# Patient Record
Sex: Male | Born: 1940
Health system: Southern US, Community
[De-identification: ages and names within clinical notes are randomized; demographics above are authoritative.]

## PROBLEM LIST (undated history)

## (undated) DIAGNOSIS — A669 Yaws, unspecified: Secondary | ICD-10-CM

## (undated) DIAGNOSIS — E785 Hyperlipidemia, unspecified: Secondary | ICD-10-CM

## (undated) DIAGNOSIS — E119 Type 2 diabetes mellitus without complications: Secondary | ICD-10-CM

## (undated) DIAGNOSIS — C801 Malignant (primary) neoplasm, unspecified: Secondary | ICD-10-CM

## (undated) DIAGNOSIS — E1122 Type 2 diabetes mellitus with diabetic chronic kidney disease: Secondary | ICD-10-CM

## (undated) DIAGNOSIS — I10 Essential (primary) hypertension: Secondary | ICD-10-CM

## (undated) HISTORY — DX: Malignant (primary) neoplasm, unspecified: C80.1

## (undated) HISTORY — DX: Yaws, unspecified: A66.9

## (undated) HISTORY — DX: Hyperlipidemia, unspecified: E78.5

## (undated) HISTORY — DX: Type 2 diabetes mellitus with diabetic chronic kidney disease: E11.22

---

## 2002-04-24 HISTORY — PX: PROSTATE SURGERY: SHX751

## 2005-04-24 DIAGNOSIS — E1122 Type 2 diabetes mellitus with diabetic chronic kidney disease: Secondary | ICD-10-CM

## 2005-04-24 HISTORY — DX: Type 2 diabetes mellitus with diabetic chronic kidney disease: E11.22

## 2014-10-09 ENCOUNTER — Other Ambulatory Visit: Payer: Self-pay | Admitting: Family Medicine

## 2014-10-21 ENCOUNTER — Ambulatory Visit: Payer: Self-pay | Admitting: Family Medicine

## 2014-10-23 ENCOUNTER — Ambulatory Visit (INDEPENDENT_AMBULATORY_CARE_PROVIDER_SITE_OTHER): Payer: PPO | Admitting: Family Medicine

## 2014-10-23 ENCOUNTER — Encounter: Payer: Self-pay | Admitting: Family Medicine

## 2014-10-23 VITALS — BP 126/74 | HR 68 | Temp 97.4°F | Resp 16 | Ht 68.5 in | Wt 177.2 lb

## 2014-10-23 DIAGNOSIS — E1122 Type 2 diabetes mellitus with diabetic chronic kidney disease: Secondary | ICD-10-CM

## 2014-10-23 DIAGNOSIS — D171 Benign lipomatous neoplasm of skin and subcutaneous tissue of trunk: Secondary | ICD-10-CM | POA: Diagnosis not present

## 2014-10-23 DIAGNOSIS — N183 Chronic kidney disease, stage 3 (moderate): Secondary | ICD-10-CM | POA: Diagnosis not present

## 2014-10-23 DIAGNOSIS — E785 Hyperlipidemia, unspecified: Secondary | ICD-10-CM | POA: Diagnosis not present

## 2014-10-23 DIAGNOSIS — I10 Essential (primary) hypertension: Secondary | ICD-10-CM | POA: Diagnosis not present

## 2014-10-23 DIAGNOSIS — N189 Chronic kidney disease, unspecified: Secondary | ICD-10-CM | POA: Diagnosis not present

## 2014-10-23 DIAGNOSIS — E1169 Type 2 diabetes mellitus with other specified complication: Secondary | ICD-10-CM | POA: Diagnosis not present

## 2014-10-23 DIAGNOSIS — N1831 Chronic kidney disease, stage 3a: Secondary | ICD-10-CM

## 2014-10-23 DIAGNOSIS — N182 Chronic kidney disease, stage 2 (mild): Secondary | ICD-10-CM | POA: Insufficient documentation

## 2014-10-23 LAB — POCT GLYCOSYLATED HEMOGLOBIN (HGB A1C): Hemoglobin A1C: 6.3

## 2014-10-23 MED ORDER — GLIPIZIDE 5 MG PO TABS: 5.0000 mg | ORAL_TABLET | Freq: Every day | ORAL | Status: DC

## 2014-10-23 MED ORDER — LISINOPRIL 20 MG PO TABS: 20.0000 mg | ORAL_TABLET | Freq: Every day | ORAL | Status: DC

## 2014-10-23 NOTE — Assessment & Plan Note (Signed)
Last GFR was 53- recheck CMP today. On ACE for renal protection. Pt is aware to stay hydrated and avoid OTC NSAIDs.  Consider nephrology referral when GFR <= 30.

## 2014-10-23 NOTE — Progress Notes (Signed)
Subjective:    Patient ID: Daniel Carney, male    DOB: [birth year], 74 y.o.   MRN: [id number]  HPI: Daniel Carney is a 74 y.o. male presenting on 10/23/2014 for Diabetes; Hypertension; and Hyperlipidemia  Last A1c was 6.7% in March of 2016. He has been compliant with diet changes and exercises daily.   Diabetes He presents for his follow-up diabetic visit. He has type 2 diabetes mellitus. His disease course has been improving. There are no hypoglycemic associated symptoms. Pertinent negatives for hypoglycemia include no headaches. Pertinent negatives for diabetes include no blurred vision, no chest pain, no foot paresthesias, no polydipsia, no polyphagia, no polyuria and no visual change. There are no hypoglycemic complications. Diabetic complications include nephropathy. Risk factors for coronary artery disease include diabetes mellitus, dyslipidemia, male sex and hypertension. He is compliant with treatment all of the time. His weight is stable. He has not had a previous visit with a dietitian. He participates in exercise daily. His overall blood glucose range is 90-110 mg/dl. An ACE inhibitor/angiotensin II receptor blocker is being taken. He does not see a podiatrist.Eye exam is current.  Hypertension This is a chronic problem. The current episode started more than 1 year ago. The problem is controlled. Pertinent negatives include no blurred vision, chest pain, headaches, palpitations, peripheral edema, PND or shortness of breath. Past treatments include ACE inhibitors. The current treatment provides moderate improvement. There are no compliance problems.  Hypertensive end-organ damage includes kidney disease.  Hyperlipidemia This is a chronic problem. Recent lipid tests were reviewed and are high. Exacerbating diseases include diabetes. Pertinent negatives include no chest pain or shortness of breath. Compliance problems include medication side effects.  Risk factors for coronary artery disease  include diabetes mellitus, male sex and hypertension.  Pt stopped pravachol because he had muscle pain. Has been unable to tolerate statins in the past. He takes fish oil daily. He also takes aspirin for secondary prevention.   Pt also mentioned a nodule present on his back x 20 years. It has been evaluated by several providers and he was told it is benign.  Nodule is unchanged, non-tender or inflamed. Pt just wanted provider to be aware.   Past Medical History  Diagnosis Date  . Incontinence   . Cancer     prostate    No current outpatient prescriptions on file prior to visit.   No current facility-administered medications on file prior to visit.    Review of Systems  Constitutional: Negative for fever and chills.  HENT: Negative for rhinorrhea, sinus pressure and trouble swallowing.   Eyes: Negative.  Negative for blurred vision.  Respiratory: Negative for chest tightness, shortness of breath and wheezing.   Cardiovascular: Negative for chest pain, palpitations, leg swelling and PND.  Gastrointestinal: Negative.   Endocrine: Negative for cold intolerance, heat intolerance, polydipsia, polyphagia and polyuria.  Genitourinary: Negative for dysuria and flank pain.  Neurological: Negative for light-headedness, numbness and headaches.  Psychiatric/Behavioral: Negative.    Per HPI unless specifically indicated above     Objective:    BP 126/74 mmHg  Pulse 68  Temp(Src) 97.4 F (36.3 C) (Oral)  Resp 16  Ht 5' 8.5" (1.74 m)  Wt 177 lb 3.2 oz (80.377 kg)  BMI 26.55 kg/m2  Wt Readings from Last 3 Encounters:  10/23/14 177 lb 3.2 oz (80.377 kg)    Physical Exam  Constitutional: He is oriented to person, place, and time. He appears well-developed and well-nourished. No distress.  Neck: Normal range  of motion. Neck supple. No thyromegaly present.  Cardiovascular: Normal rate and regular rhythm.  Exam reveals no gallop and no friction rub.   No murmur heard. Pulmonary/Chest:  Effort normal and breath sounds normal.  Abdominal: Soft. Bowel sounds are normal. There is no tenderness. There is no rebound.  Musculoskeletal: Normal range of motion. He exhibits no edema or tenderness.  Lymphadenopathy:    He has no cervical adenopathy.  Neurological: He is alert and oriented to person, place, and time.  Skin: Skin is warm and dry. He is not diaphoretic.      Diabetic Foot Exam - Simple   Simple Foot Form  Diabetic Foot exam was performed with the following findings:  Yes 10/23/2014  9:58 AM  Visual Inspection  No deformities, no ulcerations, no other skin breakdown bilaterally:  Yes  Sensation Testing  Intact to touch and monofilament testing bilaterally:  Yes  Pulse Check  Posterior Tibialis and Dorsalis pulse intact bilaterally:  Yes  Comments       Results for orders placed or performed in visit on 10/23/14  POCT HgB A1C  Result Value Ref Range   Hemoglobin A1C 6.3       Assessment & Plan:   Problem List Items Addressed This Visit      Cardiovascular and Mediastinum   Hypertension    Controlled today. Continue current medications.       Relevant Medications   pravastatin (PRAVACHOL) 40 MG tablet   lisinopril (PRINIVIL,ZESTRIL) 20 MG tablet   aspirin EC 81 MG tablet     Endocrine   Type 2 diabetes mellitus with diabetic chronic kidney disease - Primary    Current A1c is doing very well. Discussed backing off medication with patient and goals of DM care with older adults. Pt would prefer to stay on current dosing. Reviewed hypoglycemia protocol. He will call the office for medication titration if sugars are <80 consistently.   Eye exam 09/16/14 Foot exam done. On ARB for renal protection.       Relevant Medications   pravastatin (PRAVACHOL) 40 MG tablet   lisinopril (PRINIVIL,ZESTRIL) 20 MG tablet   glipiZIDE (GLUCOTROL) 5 MG tablet   aspirin EC 81 MG tablet   Other Relevant Orders   POCT HgB A1C (Completed)   Lipid panel      Genitourinary   Chronic kidney disease (CKD) stage G3a/A2, moderately decreased glomerular filtration rate (GFR) between 45-59 mL/min/1.73 square meter and albuminuria creatinine ratio between 30-299 mg/g    Last GFR was 53- recheck CMP today. On ACE for renal protection. Pt is aware to stay hydrated and avoid OTC NSAIDs.  Consider nephrology referral when GFR <= 30.       Relevant Orders   Comprehensive metabolic panel     Other   Hyperlipidemia associated with type 2 diabetes mellitus    Pt does not tolerate statins. Offered zetia as an alternative- he declined further medication at this time. Reviewed cardiovascular risk with DM. Encouraged continued fish oil, diet, and exercise changes.  Check lipids today.       Relevant Medications   pravastatin (PRAVACHOL) 40 MG tablet   lisinopril (PRINIVIL,ZESTRIL) 20 MG tablet   glipiZIDE (GLUCOTROL) 5 MG tablet   aspirin EC 81 MG tablet    Other Visit Diagnoses    Lipoma of back        Nodule on back present x 20 years. Unchanged. Pt is not interested in surgical removal or evaluation.  Meds ordered this encounter  Medications  . DISCONTD: lisinopril (PRINIVIL,ZESTRIL) 20 MG tablet    Sig: TK 1 T PO D    Refill:  4  . pravastatin (PRAVACHOL) 40 MG tablet    Sig: TK 1 T PO HS    Refill:  11  . lisinopril (PRINIVIL,ZESTRIL) 20 MG tablet    Sig: Take 1 tablet (20 mg total) by mouth daily.    Dispense:  90 tablet    Refill:  3    Order Specific Question:  Supervising Provider    Answer:  Arlis Porta [phone]  . glipiZIDE (GLUCOTROL) 5 MG tablet    Sig: Take 1 tablet (5 mg total) by mouth daily.    Dispense:  90 tablet    Refill:  3    Order Specific Question:  Supervising Provider    Answer:  Arlis Porta [phone]  . aspirin EC 81 MG tablet    Sig: Take 81 mg by mouth daily.  . Omega-3 Fatty Acids (FISH OIL) 1000 MG CAPS    Sig: Take 1 capsule by mouth daily.      Follow up plan: Return in about 5  months (around 03/25/2015).

## 2014-10-23 NOTE — Assessment & Plan Note (Signed)
Controlled today.  Continue current medications  

## 2014-10-23 NOTE — Patient Instructions (Signed)
Diabetes: Keep up the good work!  Your A1c is doing really well. As discussed in the visit, if you develop low blood sugars consistently < 70-80 please call me and let me know. We may need to change your medication.   Please check your blood glucose once daily. If your glucose is < 70 mg/dl or you have symptoms of hypoglycemia confusion, dizziness, headache, hunger, jitteriness and sweating please drink 4 oz of juice or soda.  Check blood glucose 15 minutes later. If it has not risen to >100, please seek medical attention. If > 100 please eat a snack containing protein such as peanut butter and crackers.  Please continue to watch the nodule on your back. As discussed in the visit, we can have a surgeon evaluate and remove it if needed.

## 2014-10-23 NOTE — Assessment & Plan Note (Signed)
Pt does not tolerate statins. Offered zetia as an alternative- he declined further medication at this time. Reviewed cardiovascular risk with DM. Encouraged continued fish oil, diet, and exercise changes.  Check lipids today.

## 2014-10-23 NOTE — Assessment & Plan Note (Signed)
Current A1c is doing very well. Discussed backing off medication with patient and goals of DM care with older adults. Pt would prefer to stay on current dosing. Reviewed hypoglycemia protocol. He will call the office for medication titration if sugars are <80 consistently.   Eye exam 09/16/14 Foot exam done. On ARB for renal protection.

## 2014-10-29 LAB — COMPREHENSIVE METABOLIC PANEL
A/G RATIO: 1.3 (ref 1.1–2.5)
ALT: 17 IU/L (ref 0–44)
AST: 23 IU/L (ref 0–40)
Albumin: 4.3 g/dL (ref 3.5–4.8)
Alkaline Phosphatase: 83 IU/L (ref 39–117)
BUN / CREAT RATIO: 10 (ref 10–22)
BUN: 14 mg/dL (ref 8–27)
Bilirubin Total: 0.6 mg/dL (ref 0.0–1.2)
CALCIUM: 9.7 mg/dL (ref 8.6–10.2)
CO2: 23 mmol/L (ref 18–29)
CREATININE: 1.34 mg/dL — AB (ref 0.76–1.27)
Chloride: 101 mmol/L (ref 97–108)
GFR calc Af Amer: 60 mL/min/{1.73_m2} (ref >59)
GFR calc non Af Amer: 52 mL/min/{1.73_m2} — ABNORMAL LOW (ref >59)
GLOBULIN, TOTAL: 3.3 g/dL (ref 1.5–4.5)
GLUCOSE: 119 mg/dL — AB (ref 65–99)
POTASSIUM: 4.5 mmol/L (ref 3.5–5.2)
SODIUM: 141 mmol/L (ref 134–144)
Total Protein: 7.6 g/dL (ref 6.0–8.5)

## 2014-10-29 LAB — LIPID PANEL
Chol/HDL Ratio: 4.1 ratio units (ref 0.0–5.0)
Cholesterol, Total: 160 mg/dL (ref 100–199)
HDL: 39 mg/dL — AB (ref >39)
LDL Calculated: 98 mg/dL (ref 0–99)
TRIGLYCERIDES: 114 mg/dL (ref 0–149)
VLDL CHOLESTEROL CAL: 23 mg/dL (ref 5–40)

## 2014-10-29 NOTE — Progress Notes (Signed)
LMTCB

## 2014-10-29 NOTE — Progress Notes (Signed)
Pt advised wanted a copy of test result mailed it to pt.

## 2014-12-09 ENCOUNTER — Other Ambulatory Visit: Payer: Self-pay | Admitting: Family Medicine

## 2014-12-22 ENCOUNTER — Emergency Department
Admission: EM | Admit: 2014-12-22 | Discharge: 2014-12-22 | Disposition: A | Discharge status: Home / Self Care | Payer: PPO | Attending: Emergency Medicine | Admitting: Emergency Medicine

## 2014-12-22 ENCOUNTER — Encounter: Payer: Self-pay | Admitting: *Deleted

## 2014-12-22 ENCOUNTER — Other Ambulatory Visit: Payer: Self-pay

## 2014-12-22 DIAGNOSIS — R531 Weakness: Secondary | ICD-10-CM | POA: Diagnosis present

## 2014-12-22 DIAGNOSIS — E785 Hyperlipidemia, unspecified: Secondary | ICD-10-CM | POA: Insufficient documentation

## 2014-12-22 DIAGNOSIS — R5383 Other fatigue: Secondary | ICD-10-CM | POA: Diagnosis not present

## 2014-12-22 DIAGNOSIS — Z79899 Other long term (current) drug therapy: Secondary | ICD-10-CM | POA: Diagnosis not present

## 2014-12-22 DIAGNOSIS — N189 Chronic kidney disease, unspecified: Secondary | ICD-10-CM | POA: Diagnosis not present

## 2014-12-22 DIAGNOSIS — E1169 Type 2 diabetes mellitus with other specified complication: Secondary | ICD-10-CM | POA: Insufficient documentation

## 2014-12-22 DIAGNOSIS — E1122 Type 2 diabetes mellitus with diabetic chronic kidney disease: Secondary | ICD-10-CM | POA: Diagnosis not present

## 2014-12-22 DIAGNOSIS — Z7982 Long term (current) use of aspirin: Secondary | ICD-10-CM | POA: Insufficient documentation

## 2014-12-22 DIAGNOSIS — I129 Hypertensive chronic kidney disease with stage 1 through stage 4 chronic kidney disease, or unspecified chronic kidney disease: Secondary | ICD-10-CM | POA: Insufficient documentation

## 2014-12-22 HISTORY — DX: Essential (primary) hypertension: I10

## 2014-12-22 HISTORY — DX: Type 2 diabetes mellitus without complications: E11.9

## 2014-12-22 LAB — CBC WITH DIFFERENTIAL/PLATELET
Basophils Absolute: 0.2 10*3/uL — ABNORMAL HIGH (ref 0–0.1)
Basophils Relative: 3 %
EOS PCT: 3 %
Eosinophils Absolute: 0.2 10*3/uL (ref 0–0.7)
HEMATOCRIT: 41.8 % (ref 40.0–52.0)
Hemoglobin: 13.3 g/dL (ref 13.0–18.0)
LYMPHS PCT: 22 %
Lymphs Abs: 1.3 10*3/uL (ref 1.0–3.6)
MCH: 26.4 pg (ref 26.0–34.0)
MCHC: 31.8 g/dL — AB (ref 32.0–36.0)
MCV: 83 fL (ref 80.0–100.0)
MONO ABS: 0.5 10*3/uL (ref 0.2–1.0)
MONOS PCT: 8 %
NEUTROS ABS: 3.8 10*3/uL (ref 1.4–6.5)
Neutrophils Relative %: 64 %
Platelets: 115 10*3/uL — ABNORMAL LOW (ref 150–440)
RBC: 5.03 MIL/uL (ref 4.40–5.90)
RDW: 13.8 % (ref 11.5–14.5)
WBC: 6 10*3/uL (ref 3.8–10.6)

## 2014-12-22 LAB — BASIC METABOLIC PANEL
Anion gap: 7 (ref 5–15)
BUN: 15 mg/dL (ref 6–20)
CHLORIDE: 104 mmol/L (ref 101–111)
CO2: 28 mmol/L (ref 22–32)
CREATININE: 1.33 mg/dL — AB (ref 0.61–1.24)
Calcium: 9.4 mg/dL (ref 8.9–10.3)
GFR calc Af Amer: 59 mL/min — ABNORMAL LOW (ref >60)
GFR calc non Af Amer: 51 mL/min — ABNORMAL LOW (ref >60)
GLUCOSE: 103 mg/dL — AB (ref 65–99)
POTASSIUM: 3.9 mmol/L (ref 3.5–5.1)
Sodium: 139 mmol/L (ref 135–145)

## 2014-12-22 LAB — TROPONIN I: Troponin I: <0.03 ng/mL (ref <0.031)

## 2014-12-22 NOTE — ED Notes (Signed)
Pt woke up this am feeling weak.  Pt advises he goes to the [facility] every morning at 5 am and this am felt weak and didn't go.  Wants to be checked out.  Pt ambulatory without problems, equal grip strength.

## 2014-12-22 NOTE — ED Provider Notes (Signed)
[facility] Emergency Department Provider Note  ____________________________________________  Time seen: 10:50 AM  I have reviewed the triage vital signs and the nursing notes.   HISTORY  Chief Complaint Weakness    HPI Daniel Carney is a 74 y.o. male who complains of fatigue since waking up this morning. He is normally very active. He has hypertension and diabetes but takes all of his medications. He also works out almost every day of the week. This morning when he woke up he felt too tired to go to the gym. He denies any other symptoms. No recent illness or sick contacts. He's been eating and drinking normally. Blood sugars have remained under control. No chest pain shortness of breath fever chills nausea vomiting diarrhea abdominal pain back pain headache vision changes numbness tingling or focalweakness.     Past Medical History  Diagnosis Date  . Incontinence   . Cancer     prostate  . Diabetes mellitus without complication   . Hypertension      Patient Active Problem List   Diagnosis Date Noted  . Chronic kidney disease (CKD) stage G3a/A2, moderately decreased glomerular filtration rate (GFR) between 45-59 mL/min/1.73 square meter and albuminuria creatinine ratio between 30-299 mg/g 10/23/2014  . Type 2 diabetes mellitus with diabetic chronic kidney disease 10/23/2014  . Hyperlipidemia associated with type 2 diabetes mellitus 10/23/2014  . Hypertension 10/23/2014     Past Surgical History  Procedure Laterality Date  . Prostate surgery  2004     Current Outpatient Rx  Name  Route  Sig  Dispense  Refill  . aspirin EC 81 MG tablet   Oral   Take 81 mg by mouth daily.         Marland Kitchen glipiZIDE (GLUCOTROL) 5 MG tablet   Oral   Take 1 tablet (5 mg total) by mouth daily.   90 tablet   3   . lisinopril (PRINIVIL,ZESTRIL) 20 MG tablet      TAKE 1 TABLET BY MOUTH DAILY   30 tablet   11   . Omega-3 Fatty Acids (FISH OIL) 1000 MG CAPS  Oral   Take 1 capsule by mouth daily.         . pravastatin (PRAVACHOL) 40 MG tablet      TK 1 T PO HS      11      Allergies Review of patient's allergies indicates no known allergies.   Family History  Problem Relation Age of Onset  . Diabetes Mother     Social History Social History  Substance Use Topics  . Smoking status: Never Smoker   . Smokeless tobacco: None  . Alcohol Use: No    Review of Systems  Constitutional:   No fever or chills. No weight changes. Positive fatigue Eyes:   No blurry vision or double vision.  ENT:   No sore throat. Cardiovascular:   No chest pain. Respiratory:   No dyspnea or cough. Gastrointestinal:   Negative for abdominal pain, vomiting and diarrhea.  No BRBPR or melena. Genitourinary:   Negative for dysuria, urinary retention, bloody urine, or difficulty urinating. Musculoskeletal:   Negative for back pain. No joint swelling or pain. Skin:   Negative for rash. Neurological:   Negative for headaches, focal weakness or numbness. Psychiatric:  No anxiety or depression.   Endocrine:  No hot/cold intolerance, changes in energy, or sleep difficulty.  10-point ROS otherwise negative.  ____________________________________________   PHYSICAL EXAM:  VITAL SIGNS: ED Triage Vitals  Enc Vitals Group     BP 12/22/14 1058 126/60 mmHg     Pulse Rate 12/22/14 1058 56     Resp 12/22/14 1058 18     Temp 12/22/14 1058 97.7 F (36.5 C)     Temp Source 12/22/14 1058 Oral     SpO2 12/22/14 1058 99 %     Weight 12/22/14 1058 173 lb (78.472 kg)     Height 12/22/14 1058 5\' 8"  (1.727 m)     Head Cir --      Peak Flow --      Pain Score --      Pain Loc --      Pain Edu? --      Excl. in [place]? --      Constitutional:   Alert and oriented. Well appearing and in no distress.well-nourished physically fit male. Eyes:   No scleral icterus. No conjunctival pallor. PERRL. EOMI ENT   Head:   Normocephalic and atraumatic.   Nose:   No  congestion/rhinnorhea. No septal hematoma   Mouth/Throat:   MMM, no pharyngeal erythema. No peritonsillar mass. No uvula shift.   Neck:   No stridor. No SubQ emphysema. No meningismus. Hematological/Lymphatic/Immunilogical:   No cervical lymphadenopathy. Cardiovascular:   RRR. Normal and symmetric distal pulses are present in all extremities. No murmurs, rubs, or gallops. Respiratory:   Normal respiratory effort without tachypnea nor retractions. Breath sounds are clear and equal bilaterally. No wheezes/rales/rhonchi. Gastrointestinal:   Soft and nontender. No distention. There is no CVA tenderness.  No rebound, rigidity, or guarding. Genitourinary:   deferred Musculoskeletal:   Nontender with normal range of motion in all extremities. No joint effusions.  No lower extremity tenderness.  No edema. Neurologic:   Normal speech and language.  CN 2-10 normal. Motor grossly intact. No pronator drift.  Normal gait. No gross focal neurologic deficits are appreciated.  Skin:    Skin is warm, dry and intact. No rash noted.  No petechiae, purpura, or bullae. Psychiatric:   Mood and affect are normal. Speech and behavior are normal. Patient exhibits appropriate insight and judgment.  ____________________________________________    LABS (pertinent positives/negatives) (all labs ordered are listed, but only abnormal results are displayed) Labs Reviewed  BASIC METABOLIC PANEL - Abnormal; Notable for the following:    Glucose, Bld 103 (*)    Creatinine, Ser 1.33 (*)    GFR calc non Af Amer 51 (*)    GFR calc Af Amer 59 (*)    All other components within normal limits  CBC WITH DIFFERENTIAL/PLATELET - Abnormal; Notable for the following:    MCHC 31.8 (*)    Platelets 115 (*)    Basophils Absolute 0.2 (*)    All other components within normal limits  TROPONIN I  CBC WITH DIFFERENTIAL/PLATELET   ____________________________________________   EKG  Interpreted by me Sinus bradycardia  rate of 56, normal axis intervals QRS and ST segments and T waves.  ____________________________________________    RADIOLOGY    ____________________________________________   PROCEDURES   ____________________________________________   INITIAL IMPRESSION / ASSESSMENT AND PLAN / ED COURSE  Pertinent labs & imaging results that were available during my care of the patient were reviewed by me and considered in my medical decision making (see chart for details).  Patient very well appearing. No acute symptoms other than fatigue. Exam is reassuring. EKG is unremarkable. We'll check labs, no severe findings we'll discharge to follow up with primary care. They related to overexertion or impending  viral illness. Low suspicion for ACS PE TAD pneumothorax carditis mediastinitis AAA sepsis.     ____________________________________________   FINAL CLINICAL IMPRESSION(S) / ED DIAGNOSES  Final diagnoses:  Other fatigue      Carrie Mew, MD 12/22/14 [phone]

## 2014-12-22 NOTE — ED Notes (Addendum)
Woke up this AM to go to gym at 0530, starts every day this way. This morning, he felt weak.  Drank a cup of cocoa with no improvement. Head feels heavy, stomach does not feel right. Blood sugar 98 this AM

## 2014-12-22 NOTE — Discharge Instructions (Signed)

## 2014-12-24 ENCOUNTER — Ambulatory Visit: Payer: PPO | Admitting: Family Medicine

## 2015-03-31 ENCOUNTER — Other Ambulatory Visit: Payer: Self-pay | Admitting: Family Medicine

## 2015-03-31 ENCOUNTER — Telehealth: Payer: Self-pay | Admitting: Family Medicine

## 2015-03-31 DIAGNOSIS — E1122 Type 2 diabetes mellitus with diabetic chronic kidney disease: Secondary | ICD-10-CM

## 2015-03-31 MED ORDER — LISINOPRIL 20 MG PO TABS: 20.0000 mg | ORAL_TABLET | Freq: Every day | ORAL | Status: DC

## 2015-03-31 MED ORDER — GLIPIZIDE 5 MG PO TABS: 5.0000 mg | ORAL_TABLET | Freq: Every day | ORAL | Status: DC

## 2015-03-31 NOTE — Telephone Encounter (Signed)
Pt needs refill on glipizide 5 mg and lisinopril 20 mg sent to [facility] [address].  His call back number is [phone]

## 2015-04-29 ENCOUNTER — Encounter: Payer: Self-pay | Admitting: Family Medicine

## 2015-04-29 ENCOUNTER — Ambulatory Visit (INDEPENDENT_AMBULATORY_CARE_PROVIDER_SITE_OTHER): Payer: PPO | Admitting: Family Medicine

## 2015-04-29 VITALS — BP 128/76 | HR 67 | Temp 97.9°F | Resp 16 | Ht 68.5 in | Wt 171.0 lb

## 2015-04-29 DIAGNOSIS — E1122 Type 2 diabetes mellitus with diabetic chronic kidney disease: Secondary | ICD-10-CM | POA: Diagnosis not present

## 2015-04-29 DIAGNOSIS — E785 Hyperlipidemia, unspecified: Secondary | ICD-10-CM | POA: Diagnosis not present

## 2015-04-29 DIAGNOSIS — E1169 Type 2 diabetes mellitus with other specified complication: Secondary | ICD-10-CM

## 2015-04-29 DIAGNOSIS — N182 Chronic kidney disease, stage 2 (mild): Secondary | ICD-10-CM

## 2015-04-29 LAB — POCT UA - MICROALBUMIN
Albumin/Creatinine Ratio, Urine, POC: 0
Creatinine, POC: 0 mg/dL
Microalbumin Ur, POC: 50 mg/L

## 2015-04-29 LAB — POCT GLYCOSYLATED HEMOGLOBIN (HGB A1C): Hemoglobin A1C: 6.2

## 2015-04-29 MED ORDER — LISINOPRIL 20 MG PO TABS: 20.0000 mg | ORAL_TABLET | Freq: Every day | ORAL | Status: DC

## 2015-04-29 MED ORDER — GLIPIZIDE 5 MG PO TABS: 5.0000 mg | ORAL_TABLET | Freq: Every day | ORAL | Status: DC

## 2015-04-29 MED ORDER — LANCETS MISC: Status: DC

## 2015-04-29 NOTE — Patient Instructions (Addendum)
Your diabetes is doing great!  Keep up the good work!  Continue to check your blood sugars. Please let me know if your blood sugars get low or you are having symptoms of low blood sugar.   We will check your labwork to monitor your kidneys.

## 2015-04-29 NOTE — Assessment & Plan Note (Signed)
A1c is stable- very well controlled. Have talked about stopping medication, pt would like to continue. ACE for renal protection.  Urine Microalbumin is positive- pt is on an ACE.  Eye exam due in May. Foot exam done today.

## 2015-04-29 NOTE — Assessment & Plan Note (Signed)
Last GFR 60. Recheck kidney function today. Microalbumin + in urine. Cr has been stable. On ACE for renal protection. Pt cautioned to avoid NSAIDs

## 2015-04-29 NOTE — Progress Notes (Signed)
Subjective:    Patient ID: Daniel Carney, male    DOB: 1941-03-06, 75 y.o.   MRN: [id number]  HPI: Daniel Carney is a 75 y.o. male presenting on 04/29/2015 for Diabetes; Hypertension; and Hyperlipidemia   Diabetes He presents for his follow-up diabetic visit. He has type 2 diabetes mellitus. His disease course has been stable. There are no hypoglycemic associated symptoms. Pertinent negatives for hypoglycemia include no confusion, dizziness, headaches, hunger or nervousness/anxiousness. Pertinent negatives for diabetes include no blurred vision, no chest pain, no fatigue, no foot paresthesias, no foot ulcerations, no polydipsia, no polyphagia, no polyuria, no visual change and no weakness. There are no hypoglycemic complications. Symptoms are stable. Diabetic complications include nephropathy. Risk factors for coronary artery disease include dyslipidemia, male sex and diabetes mellitus. He is compliant with treatment all of the time. He is following a diabetic diet. An ACE inhibitor/angiotensin II receptor blocker is being taken. He does not see a podiatrist.Eye exam is current.  Hypertension This is a chronic problem. The problem is controlled. Pertinent negatives include no blurred vision, chest pain, headaches, palpitations, peripheral edema or shortness of breath. Risk factors for coronary artery disease include dyslipidemia, diabetes mellitus and male gender. Past treatments include ACE inhibitors. The current treatment provides significant improvement. Identifiable causes of hypertension include chronic renal disease.  Hyperlipidemia This is a chronic problem. Exacerbating diseases include chronic renal disease and diabetes. Associated symptoms include myalgias (when taking statin). Pertinent negatives include no chest pain or shortness of breath. Current antihyperlipidemic treatment includes herbal therapy. The current treatment provides mild improvement of lipids.    Pt presents for diabetes  follow-up. Overall doing well. Last A1c was 6.3%. No numbness tingling in feet. Eye exam due in June.  Did not tolerate statin in July. Stopped taking.   Past Medical History  Diagnosis Date  . Incontinence   . Cancer     prostate  . Diabetes mellitus without complication   . Hypertension     Current Outpatient Prescriptions on File Prior to Visit  Medication Sig  . aspirin EC 81 MG tablet Take 81 mg by mouth daily.  . Omega-3 Fatty Acids (FISH OIL) 1000 MG CAPS Take 1 capsule by mouth daily.   No current facility-administered medications on file prior to visit.    Review of Systems  Constitutional: Negative for fever, chills and fatigue.  Eyes: Negative for blurred vision and visual disturbance.  Respiratory: Negative for chest tightness, shortness of breath and wheezing.   Cardiovascular: Negative for chest pain and palpitations.  Gastrointestinal: Negative.   Endocrine: Negative for cold intolerance, heat intolerance, polydipsia, polyphagia and polyuria.  Musculoskeletal: Positive for myalgias (when taking statin).  Neurological: Negative for dizziness, weakness, light-headedness, numbness and headaches.  Psychiatric/Behavioral: Negative.  Negative for confusion. The patient is not nervous/anxious.    Per HPI unless specifically indicated above     Objective:    BP 128/76 mmHg  Pulse 67  Temp(Src) 97.9 F (36.6 C) (Oral)  Resp 16  Ht 5' 8.5" (1.74 m)  Wt 171 lb (77.565 kg)  BMI 25.62 kg/m2  Wt Readings from Last 3 Encounters:  04/29/15 171 lb (77.565 kg)  12/22/14 173 lb (78.472 kg)  10/23/14 177 lb 3.2 oz (80.377 kg)    Physical Exam  Constitutional: He is oriented to person, place, and time. He appears well-developed and well-nourished. No distress.  Neck: Normal range of motion. Neck supple. No thyromegaly present.  Cardiovascular: Normal rate and regular rhythm.  Exam reveals  no gallop and no friction rub.   No murmur heard. Pulmonary/Chest: Effort normal  and breath sounds normal.  Abdominal: Soft. Bowel sounds are normal. There is no tenderness. There is no rebound.  Musculoskeletal: Normal range of motion. He exhibits no edema or tenderness.  Lymphadenopathy:    He has no cervical adenopathy.  Neurological: He is alert and oriented to person, place, and time.  Skin: Skin is warm and dry. He is not diaphoretic.   Diabetic Foot Exam - Simple   Simple Foot Form  Diabetic Foot exam was performed with the following findings:  Yes 04/29/2015 11:11 AM  Visual Inspection  See comments:  Yes  Sensation Testing  Intact to touch and monofilament testing bilaterally:  Yes  Pulse Check  Posterior Tibialis and Dorsalis pulse intact bilaterally:  Yes  Comments  Black toe nail for 4th toe, L foot.       Results for orders placed or performed in visit on 04/29/15  POCT HgB A1C  Result Value Ref Range   Hemoglobin A1C 6.2   POCT UA - Microalbumin  Result Value Ref Range   Microalbumin Ur, POC 50 mg/L   Creatinine, POC 0 mg/dL   Albumin/Creatinine Ratio, Urine, POC 0       Assessment & Plan:   Problem List Items Addressed This Visit      Endocrine   Type 2 diabetes mellitus with diabetic chronic kidney disease (HCC)    A1c is stable- very well controlled. Have talked about stopping medication, pt would like to continue. ACE for renal protection.  Urine Microalbumin is positive- pt is on an ACE.  Eye exam due in May. Foot exam done today.       Relevant Medications   Lancets MISC   lisinopril (PRINIVIL,ZESTRIL) 20 MG tablet   glipiZIDE (GLUCOTROL) 5 MG tablet   Other Relevant Orders   POCT HgB A1C (Completed)   POCT UA - Microalbumin (Completed)   Comprehensive Metabolic Panel (CMET)     Genitourinary   Chronic kidney disease (CKD) stage G2/A2, mildly decreased glomerular filtration rate (GFR) between 60-89 mL/min/1.73 square meter and albuminuria creatinine ratio between 30-299 mg/g - Primary    Last GFR 60. Recheck kidney  function today. Microalbumin + in urine. Cr has been stable. On ACE for renal protection. Pt cautioned to avoid NSAIDs        Other   Hyperlipidemia associated with type 2 diabetes mellitus ([place])    Pt did not tolerate statin therapy. Recheck lipids today. Continue fish oil and diet changes.       Relevant Medications   lisinopril (PRINIVIL,ZESTRIL) 20 MG tablet   glipiZIDE (GLUCOTROL) 5 MG tablet    Other Visit Diagnoses    Hyperlipidemia        Relevant Medications    lisinopril (PRINIVIL,ZESTRIL) 20 MG tablet    Other Relevant Orders    Lipid Profile       Meds ordered this encounter  Medications  . DISCONTD: glipiZIDE (GLUCOTROL) 5 MG tablet    Sig: Take 1 tablet (5 mg total) by mouth daily.    Dispense:  90 tablet    Refill:  3    Patient needs appt before refills.    Order Specific Question:  Supervising Provider    Answer:  Arlis Porta [id number]  . DISCONTD: lisinopril (PRINIVIL,ZESTRIL) 20 MG tablet    Sig: Take 1 tablet (20 mg total) by mouth daily.    Dispense:  90 tablet  Refill:  3    Patient needs appt before additional refills.    Order Specific Question:  Supervising Provider    Answer:  Arlis Porta [id number]  . Lancets MISC    Sig: Please dispense One Touch Lancets. Check blood glucose once daily.    Dispense:  100 each    Refill:  11    Order Specific Question:  Supervising Provider    Answer:  Arlis Porta [phone]  . lisinopril (PRINIVIL,ZESTRIL) 20 MG tablet    Sig: Take 1 tablet (20 mg total) by mouth daily.    Dispense:  90 tablet    Refill:  3    Order Specific Question:  Supervising Provider    Answer:  Arlis Porta [phone]  . glipiZIDE (GLUCOTROL) 5 MG tablet    Sig: Take 1 tablet (5 mg total) by mouth daily.    Dispense:  90 tablet    Refill:  3    Order Specific Question:  Supervising Provider    Answer:  Arlis Porta [id number]      Follow up plan: Return in about 6 months (around 10/27/2015)  for diabetes.Marland Kitchen

## 2015-04-29 NOTE — Assessment & Plan Note (Signed)
Pt did not tolerate statin therapy. Recheck lipids today. Continue fish oil and diet changes.

## 2015-04-30 DIAGNOSIS — E785 Hyperlipidemia, unspecified: Secondary | ICD-10-CM | POA: Diagnosis not present

## 2015-04-30 DIAGNOSIS — E1122 Type 2 diabetes mellitus with diabetic chronic kidney disease: Secondary | ICD-10-CM | POA: Diagnosis not present

## 2015-05-01 LAB — LIPID PANEL
CHOLESTEROL TOTAL: 214 mg/dL — AB (ref 100–199)
Chol/HDL Ratio: 4.9 ratio units (ref 0.0–5.0)
HDL: 44 mg/dL (ref >39)
LDL Calculated: 148 mg/dL — ABNORMAL HIGH (ref 0–99)
TRIGLYCERIDES: 108 mg/dL (ref 0–149)
VLDL Cholesterol Cal: 22 mg/dL (ref 5–40)

## 2015-05-01 LAB — COMPREHENSIVE METABOLIC PANEL
A/G RATIO: 1.2 (ref 1.1–2.5)
ALBUMIN: 4.2 g/dL (ref 3.5–4.8)
ALT: 25 IU/L (ref 0–44)
AST: 21 IU/L (ref 0–40)
Alkaline Phosphatase: 90 IU/L (ref 39–117)
BILIRUBIN TOTAL: 0.6 mg/dL (ref 0.0–1.2)
BUN / CREAT RATIO: 11 (ref 10–22)
BUN: 13 mg/dL (ref 8–27)
CHLORIDE: 101 mmol/L (ref 96–106)
CO2: 27 mmol/L (ref 18–29)
Calcium: 9.6 mg/dL (ref 8.6–10.2)
Creatinine, Ser: 1.21 mg/dL (ref 0.76–1.27)
GFR, EST AFRICAN AMERICAN: 67 mL/min/{1.73_m2} (ref >59)
GFR, EST NON AFRICAN AMERICAN: 58 mL/min/{1.73_m2} — AB (ref >59)
Globulin, Total: 3.5 g/dL (ref 1.5–4.5)
Glucose: 128 mg/dL — ABNORMAL HIGH (ref 65–99)
POTASSIUM: 4.5 mmol/L (ref 3.5–5.2)
Sodium: 142 mmol/L (ref 134–144)
TOTAL PROTEIN: 7.7 g/dL (ref 6.0–8.5)

## 2015-06-15 LAB — HM DIABETES EYE EXAM

## 2015-06-30 ENCOUNTER — Encounter: Payer: Self-pay | Admitting: Family Medicine

## 2015-07-13 ENCOUNTER — Emergency Department
Admission: EM | Admit: 2015-07-13 | Discharge: 2015-07-13 | Disposition: A | Discharge status: Home / Self Care | Payer: PPO | Attending: Emergency Medicine | Admitting: Emergency Medicine

## 2015-07-13 DIAGNOSIS — Y999 Unspecified external cause status: Secondary | ICD-10-CM | POA: Diagnosis not present

## 2015-07-13 DIAGNOSIS — W01198A Fall on same level from slipping, tripping and stumbling with subsequent striking against other object, initial encounter: Secondary | ICD-10-CM | POA: Diagnosis not present

## 2015-07-13 DIAGNOSIS — Z79899 Other long term (current) drug therapy: Secondary | ICD-10-CM | POA: Insufficient documentation

## 2015-07-13 DIAGNOSIS — E785 Hyperlipidemia, unspecified: Secondary | ICD-10-CM | POA: Insufficient documentation

## 2015-07-13 DIAGNOSIS — S31821A Laceration without foreign body of left buttock, initial encounter: Secondary | ICD-10-CM | POA: Diagnosis not present

## 2015-07-13 DIAGNOSIS — S01412A Laceration without foreign body of left cheek and temporomandibular area, initial encounter: Secondary | ICD-10-CM | POA: Diagnosis not present

## 2015-07-13 DIAGNOSIS — I129 Hypertensive chronic kidney disease with stage 1 through stage 4 chronic kidney disease, or unspecified chronic kidney disease: Secondary | ICD-10-CM | POA: Diagnosis not present

## 2015-07-13 DIAGNOSIS — Z8546 Personal history of malignant neoplasm of prostate: Secondary | ICD-10-CM | POA: Diagnosis not present

## 2015-07-13 DIAGNOSIS — Y939 Activity, unspecified: Secondary | ICD-10-CM | POA: Insufficient documentation

## 2015-07-13 DIAGNOSIS — Z794 Long term (current) use of insulin: Secondary | ICD-10-CM | POA: Diagnosis not present

## 2015-07-13 DIAGNOSIS — N182 Chronic kidney disease, stage 2 (mild): Secondary | ICD-10-CM | POA: Diagnosis not present

## 2015-07-13 DIAGNOSIS — E1169 Type 2 diabetes mellitus with other specified complication: Secondary | ICD-10-CM | POA: Diagnosis not present

## 2015-07-13 DIAGNOSIS — Y929 Unspecified place or not applicable: Secondary | ICD-10-CM | POA: Insufficient documentation

## 2015-07-13 MED ORDER — TETANUS-DIPHTH-ACELL PERTUSSIS 5-2.5-18.5 LF-MCG/0.5 IM SUSP
INTRAMUSCULAR | Status: AC
  Filled 2015-07-13: qty 0.5

## 2015-07-13 MED ORDER — LIDOCAINE-EPINEPHRINE (PF) 1 %-1:200000 IJ SOLN
INTRAMUSCULAR | Status: AC
  Filled 2015-07-13: qty 30

## 2015-07-13 MED ORDER — TETANUS-DIPHTH-ACELL PERTUSSIS 5-2.5-18.5 LF-MCG/0.5 IM SUSP
0.5000 mL | Freq: Once | INTRAMUSCULAR | Status: AC
  Administered 2015-07-13: 0.5 mL via INTRAMUSCULAR

## 2015-07-13 NOTE — ED Provider Notes (Signed)
[facility] Emergency Department Provider Note  ____________________________________________  Time seen: Approximately 8:09 AM  I have reviewed the triage vital signs and the nursing notes.   HISTORY  Chief Complaint Laceration    HPI Daniel Carney is a 75 y.o. male who presents for laceration evaluation to his buttocks. Patient states that he fell backwards onto a glass picture lacerating his left cheek. Last tetanus unknown.   Past Medical History  Diagnosis Date  . Incontinence   . Cancer [facility])     prostate  . Diabetes mellitus without complication ([place])   . Hypertension     Patient Active Problem List   Diagnosis Date Noted  . Chronic kidney disease (CKD) stage G2/A2, mildly decreased glomerular filtration rate (GFR) between 60-89 mL/min/1.73 square meter and albuminuria creatinine ratio between 30-299 mg/g 10/23/2014  . Type 2 diabetes mellitus with diabetic chronic kidney disease ([place]) 10/23/2014  . Hyperlipidemia associated with type 2 diabetes mellitus ([place]) 10/23/2014  . Hypertension 10/23/2014    Past Surgical History  Procedure Laterality Date  . Prostate surgery  2004    Current Outpatient Rx  Name  Route  Sig  Dispense  Refill  . aspirin EC 81 MG tablet   Oral   Take 81 mg by mouth daily.         Marland Kitchen glipiZIDE (GLUCOTROL) 5 MG tablet   Oral   Take 1 tablet (5 mg total) by mouth daily.   90 tablet   3   . Lancets MISC      Please dispense One Touch Lancets. Check blood glucose once daily.   100 each   11   . lisinopril (PRINIVIL,ZESTRIL) 20 MG tablet   Oral   Take 1 tablet (20 mg total) by mouth daily.   90 tablet   3   . Omega-3 Fatty Acids (FISH OIL) 1000 MG CAPS   Oral   Take 1 capsule by mouth daily.           Allergies Review of patient's allergies indicates no known allergies.  Family History  Problem Relation Age of Onset  . Diabetes Mother     Social History Social History  Substance Use  Topics  . Smoking status: Never Smoker   . Smokeless tobacco: None  . Alcohol Use: No    Review of Systems Constitutional: No fever/chills Musculoskeletal: Negative for back pain. Skin: Positive for laceration to left buttocks Neurological: Negative for headaches, focal weakness or numbness.  10-point ROS otherwise negative.  ____________________________________________   PHYSICAL EXAM:  VITAL SIGNS: ED Triage Vitals  Enc Vitals Group     BP 07/13/15 0733 143/75 mmHg     Pulse Rate 07/13/15 0733 75     Resp 07/13/15 0733 17     Temp 07/13/15 0733 98.2 F (36.8 C)     Temp Source 07/13/15 0733 Oral     SpO2 07/13/15 0733 97 %     Weight 07/13/15 0733 172 lb (78.019 kg)     Height 07/13/15 0733 5\' 8"  (1.727 m)     Head Cir --      Peak Flow --      Pain Score 07/13/15 0733 9     Pain Loc --      Pain Edu? --      Excl. in [place]? --     Constitutional: Alert and oriented. Well appearing and in no acute distress.   Musculoskeletal: No lower extremity tenderness nor edema.  No joint effusions.  Neurologic:  Normal speech and language. No gross focal neurologic deficits are appreciated. No gait instability. Skin:  Skin is warm, dry and intact. Flap laceration noted approximately 3 cm to the left buttocks bleeding controlled. Psychiatric: Mood and affect are normal. Speech and behavior are normal.  ____________________________________________   LABS (all labs ordered are listed, but only abnormal results are displayed)  Labs Reviewed - No data to display ____________________________________________   PROCEDURES  Procedure(s) performed: Yes  LACERATION REPAIR Performed by: Arlyss Repress Authorized by: Arlyss Repress Consent: Verbal consent obtained. Risks and benefits: risks, benefits and alternatives were discussed Consent given by: patient Patient identity confirmed: provided demographic data Prepped and Draped in normal sterile fashion Wound  explored  Laceration Location: Left buttocks  Laceration Length: 3.5 cm  No Foreign Bodies seen or palpated  Anesthesia: local infiltration  Local anesthetic: lidocaine 1 % with epinephrine  Anesthetic total: 5 ml  Irrigation method: syringe Amount of cleaning: standard  Skin closure: 4-0 Vicryl   Number of sutures: 5   Technique:Simple interrupted   Patient tolerance: Patient tolerated the procedure well with no immediate complications.  Critical Care performed: No  ____________________________________________   INITIAL IMPRESSION / ASSESSMENT AND PLAN / ED COURSE  Pertinent labs & imaging results that were available during my care of the patient were reviewed by me and considered in my medical decision making (see chart for details).  Laceration to the right buttocks and left buttocks rather patient tolerated the above noted procedure well tetanus updated at this visit. Patient to have his sutures removed in 1 week by his PCP. Tylenol Motrin over-the-counter as needed for pain. Tetanus updated. Patient voices no other emergency medical complaints at this time ____________________________________________   FINAL CLINICAL IMPRESSION(S) / ED DIAGNOSES  Final diagnoses:  Laceration of buttock, left, initial encounter     This chart was dictated using voice recognition software/Dragon. Despite best efforts to proofread, errors can occur which can change the meaning. Any change was purely unintentional.   Arlyss Repress, PA-C 07/13/15 [place], MD 07/13/15 [phone]

## 2015-07-13 NOTE — Discharge Instructions (Signed)
Laceration Care, Adult  A laceration is a cut that goes through all layers of the skin. The cut also goes into the tissue that is right under the skin. Some cuts heal on their own. Others need to be closed with stitches (sutures), staples, skin adhesive strips, or wound glue. Taking care of your cut lowers your risk of infection and helps your cut to heal better.  HOW TO TAKE CARE OF YOUR CUT  For stitches or staples:  · Keep the wound clean and dry.  · If you were given a bandage (dressing), you should change it at least one time per day or as told by your doctor. You should also change it if it gets wet or dirty.  · Keep the wound completely dry for the first 24 hours or as told by your doctor. After that time, you may take a shower or a bath. However, make sure that the wound is not soaked in water until after the stitches or staples have been removed.  · Clean the wound one time each day or as told by your doctor:    Wash the wound with soap and water.    Rinse the wound with water until all of the soap comes off.    Pat the wound dry with a clean towel. Do not rub the wound.  · After you clean the wound, put a thin layer of antibiotic ointment on it as told by your doctor. This ointment:    Helps to prevent infection.    Keeps the bandage from sticking to the wound.  · Have your stitches or staples removed as told by your doctor.  If your doctor used skin adhesive strips:   · Keep the wound clean and dry.  · If you were given a bandage, you should change it at least one time per day or as told by your doctor. You should also change it if it gets dirty or wet.  · Do not get the skin adhesive strips wet. You can take a shower or a bath, but be careful to keep the wound dry.  · If the wound gets wet, pat it dry with a clean towel. Do not rub the wound.  · Skin adhesive strips fall off on their own. You can trim the strips as the wound heals. Do not remove any strips that are still stuck to the wound. They will  fall off after a while.  If your doctor used wound glue:  · Try to keep your wound dry, but you may briefly wet it in the shower or bath. Do not soak the wound in water, such as by swimming.  · After you take a shower or a bath, gently pat the wound dry with a clean towel. Do not rub the wound.  · Do not do any activities that will make you really sweaty until the skin glue has fallen off on its own.  · Do not apply liquid, cream, or ointment medicine to your wound while the skin glue is still on.  · If you were given a bandage, you should change it at least one time per day or as told by your doctor. You should also change it if it gets dirty or wet.  · If a bandage is placed over the wound, do not let the tape for the bandage touch the skin glue.  · Do not pick at the glue. The skin glue usually stays on for 5-10 days. Then, it   falls off of the skin.  General Instructions   · To help prevent scarring, make sure to cover your wound with sunscreen whenever you are outside after stitches are removed, after adhesive strips are removed, or when wound glue stays in place and the wound is healed. Make sure to wear a sunscreen of at least 30 SPF.  · Take over-the-counter and prescription medicines only as told by your doctor.  · If you were given antibiotic medicine or ointment, take or apply it as told by your doctor. Do not stop using the antibiotic even if your wound is getting better.  · Do not scratch or pick at the wound.  · Keep all follow-up visits as told by your doctor. This is important.  · Check your wound every day for signs of infection. Watch for:    Redness, swelling, or pain.    Fluid, blood, or pus.  · Raise (elevate) the injured area above the level of your heart while you are sitting or lying down, if possible.  GET HELP IF:  · You got a tetanus shot and you have any of these problems at the injection site:    Swelling.    Very bad pain.    Redness.    Bleeding.  · You have a fever.  · A wound that was  closed breaks open.  · You notice a bad smell coming from your wound or your bandage.  · You notice something coming out of the wound, such as wood or glass.  · Medicine does not help your pain.  · You have more redness, swelling, or pain at the site of your wound.  · You have fluid, blood, or pus coming from your wound.  · You notice a change in the color of your skin near your wound.  · You need to change the bandage often because fluid, blood, or pus is coming from the wound.  · You start to have a new rash.  · You start to have numbness around the wound.  GET HELP RIGHT AWAY IF:  · You have very bad swelling around the wound.  · Your pain suddenly gets worse and is very bad.  · You notice painful lumps near the wound or on skin that is anywhere on your body.  · You have a red streak going away from your wound.  · The wound is on your hand or foot and you cannot move a finger or toe like you usually can.  · The wound is on your hand or foot and you notice that your fingers or toes look pale or bluish.     This information is not intended to replace advice given to you by your health care provider. Make sure you discuss any questions you have with your health care provider.     Document Released: 09/27/2007 Document Revised: 08/25/2014 Document Reviewed: 04/06/2014  Elsevier Interactive Patient Education ©2016 [employer]

## 2015-07-13 NOTE — ED Notes (Signed)
Pt states he fell and landed on a picture frame, glass cutting his left buttock.

## 2015-07-13 NOTE — ED Notes (Signed)
States he fell backwards on to a glass picture   Laceration noted to left buttocks...bleeding controlled at present. Superficial laceration noted approx 2 inches

## 2015-07-21 ENCOUNTER — Ambulatory Visit (INDEPENDENT_AMBULATORY_CARE_PROVIDER_SITE_OTHER): Payer: PPO | Admitting: Family Medicine

## 2015-07-21 ENCOUNTER — Encounter: Payer: Self-pay | Admitting: Family Medicine

## 2015-07-21 VITALS — BP 142/72 | HR 68 | Temp 98.6°F | Resp 16 | Ht 68.5 in | Wt 174.0 lb

## 2015-07-21 DIAGNOSIS — S31821D Laceration without foreign body of left buttock, subsequent encounter: Secondary | ICD-10-CM | POA: Diagnosis not present

## 2015-07-21 NOTE — Patient Instructions (Signed)
I have removed the sutures on your laceration.  Keep the steri-strips in place until they fall off. They will help your wound continue to close. Keep the wound as clean and dry as possible. You may shower- just cover the wound with plastic bag to keep dry. Can keep covered with a band-aid or leave open to air.   Please let me know if the wound looks like it has opened back up, drains foul smelling drainage, becomes red, inflamed or tender.

## 2015-07-21 NOTE — Progress Notes (Signed)
   Subjective:    Patient ID: Daniel Carney, male    DOB: Dec 24, 1940, 75 y.o.   MRN: [id number]  HPI: [name] is a 75 y.o. male presenting on 07/21/2015 for Suture / Staple Removal   HPI  Pt presents for suture removals from buttock laceration. Seen in ER on 3/21 for laceration to L buttock.  5 Sutures placed on 3/21. TDAP updated. Doing okay. Some soreness. No pain or fever.  Pt tripped in garage and fell over picture frame. Laceration to L buttock. No further injury.   Past Medical History  Diagnosis Date  . Incontinence   . Cancer [facility])     prostate  . Diabetes mellitus without complication ([place])   . Hypertension     Current Outpatient Prescriptions on File Prior to Visit  Medication Sig  . aspirin EC 81 MG tablet Take 81 mg by mouth daily.  Marland Kitchen glipiZIDE (GLUCOTROL) 5 MG tablet Take 1 tablet (5 mg total) by mouth daily.  . Lancets MISC Please dispense One Touch Lancets. Check blood glucose once daily.  Marland Kitchen lisinopril (PRINIVIL,ZESTRIL) 20 MG tablet Take 1 tablet (20 mg total) by mouth daily.  . Omega-3 Fatty Acids (FISH OIL) 1000 MG CAPS Take 1 capsule by mouth daily.   No current facility-administered medications on file prior to visit.    Review of Systems  Constitutional: Negative for fever and chills.  Respiratory: Negative for chest tightness, shortness of breath and wheezing.   Cardiovascular: Negative for chest pain, palpitations and leg swelling.  Musculoskeletal: Negative for gait problem.  Skin: Positive for wound. Negative for color change.  Neurological: Negative for dizziness, weakness and light-headedness.   Per HPI unless specifically indicated above     Objective:    BP 142/72 mmHg  Pulse 68  Temp(Src) 98.6 F (37 C) (Oral)  Resp 16  Ht 5' 8.5" (1.74 m)  Wt 174 lb (78.926 kg)  BMI 26.07 kg/m2  Wt Readings from Last 3 Encounters:  07/21/15 174 lb (78.926 kg)  07/13/15 172 lb (78.019 kg)  04/29/15 171 lb (77.565 kg)    Physical Exam    Constitutional: He appears well-developed and well-nourished. No distress.  Cardiovascular: Normal rate.  Exam reveals no gallop and no friction rub.   No murmur heard. Pulmonary/Chest: Effort normal and breath sounds normal.  Skin: Skin is warm and dry. Laceration (L buttock) noted. He is not diaphoretic.      Results for orders placed or performed in visit on 06/30/15  HM DIABETES EYE EXAM  Result Value Ref Range   HM Diabetic Eye Exam No Retinopathy No Retinopathy      Assessment & Plan:   Problem List Items Addressed This Visit    None    Visit Diagnoses    Laceration of left buttock without foreign body, subsequent encounter    -  Primary    Initial injury 3/21. 5 sutures removed. Site prepped with betadine and alcohol. Steri strips placed. Tolerated well. After care and return precautions reviewed.    Relevant Orders    Suture removal       No orders of the defined types were placed in this encounter.      Follow up plan: Return if symptoms worsen or fail to improve.

## 2015-08-24 ENCOUNTER — Other Ambulatory Visit: Payer: Self-pay | Admitting: Family Medicine

## 2015-08-24 DIAGNOSIS — E1122 Type 2 diabetes mellitus with diabetic chronic kidney disease: Secondary | ICD-10-CM

## 2015-08-24 MED ORDER — GLIPIZIDE 5 MG PO TABS: 5.0000 mg | ORAL_TABLET | Freq: Every day | ORAL | Status: DC

## 2015-08-24 MED ORDER — LISINOPRIL 20 MG PO TABS: 20.0000 mg | ORAL_TABLET | Freq: Every day | ORAL | Status: DC

## 2015-08-24 NOTE — Telephone Encounter (Signed)
Pt needs refills on lisinopril and glipizide.  His call back number is [phone]

## 2015-11-02 ENCOUNTER — Ambulatory Visit: Payer: PPO | Admitting: Family Medicine

## 2015-11-12 ENCOUNTER — Ambulatory Visit (INDEPENDENT_AMBULATORY_CARE_PROVIDER_SITE_OTHER): Payer: PPO | Admitting: Family Medicine

## 2015-11-12 VITALS — BP 127/79 | HR 68 | Temp 98.2°F | Resp 16 | Ht 68.5 in | Wt 173.8 lb

## 2015-11-12 DIAGNOSIS — E1122 Type 2 diabetes mellitus with diabetic chronic kidney disease: Secondary | ICD-10-CM

## 2015-11-12 DIAGNOSIS — I10 Essential (primary) hypertension: Secondary | ICD-10-CM

## 2015-11-12 DIAGNOSIS — N182 Chronic kidney disease, stage 2 (mild): Secondary | ICD-10-CM

## 2015-11-12 LAB — POCT GLYCOSYLATED HEMOGLOBIN (HGB A1C): Hemoglobin A1C: 6.6

## 2015-11-12 NOTE — Assessment & Plan Note (Signed)
Well controlled DM. Slight elevation in A1c likely diet related. Discussed diet and lifestyle modifications. Will plan to check A1c in 3 mos.  UA microalbumin- complete. Pt on ACE. Eye exam due 05/2016.

## 2015-11-12 NOTE — Assessment & Plan Note (Signed)
Kidney function is stable. Recheck BMP.

## 2015-11-12 NOTE — Assessment & Plan Note (Signed)
Well controlled. Continue ACE for renal protection.

## 2015-11-12 NOTE — Patient Instructions (Signed)
Your A1c is elevated just a little bit. No medications changes are needed but I would pay close attention to your diet and continue your exercise. We will plan on rechecking your A1c in 3 mos.   Your goal blood pressure is 140/90 Work on low salt/sodium diet - goal <1.5gm (1,500mg ) per day. Eat a diet high in fruits/vegetables and whole grains.  Look into mediterranean and DASH diet. Goal activity is 166min/wk of moderate intensity exercise.  This can be split into 30 minute chunks.  If you are not at this level, you can start with smaller 10-15 min increments and slowly build up activity. Look at [email or web address] for more resources  Please seek immediate medical attention at ER or Urgent Care if you develop: Chest pain, pressure or tightness. Shortness of breath accompanied by nausea or diaphoresis Visual changes Numbness or tingling on one side of the body Facial droop Altered mental status Or any concerning symptoms.

## 2015-11-12 NOTE — Progress Notes (Signed)
Subjective:    Patient ID: Daniel Carney, male    DOB: November 06, 1940, 75 y.o.   MRN: [id number]  HPI: Daniel Carney is a 75 y.o. male presenting on 11/12/2015 for Diabetes   HPI  Pt presents diabetes follow-up. Doing well at home. A1c is up to 6.6% but has been travelling recently and thinks his diet has been poorer than usual. No numbness or tingling in feet. Eye exam this year- 06/14/2016  Urine microalbulin was 50.  Blood pressure.   Past Medical History  Diagnosis Date  . Incontinence   . Cancer [facility])     prostate  . Diabetes mellitus without complication ([place])   . Hypertension     Current Outpatient Prescriptions on File Prior to Visit  Medication Sig  . aspirin EC 81 MG tablet Take 81 mg by mouth daily.  Marland Kitchen glipiZIDE (GLUCOTROL) 5 MG tablet Take 1 tablet (5 mg total) by mouth daily.  . Lancets MISC Please dispense One Touch Lancets. Check blood glucose once daily.  Marland Kitchen lisinopril (PRINIVIL,ZESTRIL) 20 MG tablet Take 1 tablet (20 mg total) by mouth daily.  . Omega-3 Fatty Acids (FISH OIL) 1000 MG CAPS Take 1 capsule by mouth daily.   No current facility-administered medications on file prior to visit.    Review of Systems  Constitutional: Negative for fever and chills.  HENT: Negative.   Respiratory: Negative for chest tightness, shortness of breath and wheezing.   Cardiovascular: Negative for chest pain, palpitations and leg swelling.  Gastrointestinal: Negative for nausea, vomiting and abdominal pain.  Endocrine: Negative.   Genitourinary: Negative for dysuria, urgency, discharge, penile pain and testicular pain.  Musculoskeletal: Negative for back pain, joint swelling and arthralgias.  Skin: Negative.   Neurological: Negative for dizziness, weakness, numbness and headaches.  Psychiatric/Behavioral: Negative for sleep disturbance and dysphoric mood.   Per HPI unless specifically indicated above     Objective:    BP 127/79 mmHg  Pulse 68  Temp(Src) 98.2 F (36.8  C) (Oral)  Resp 16  Ht 5' 8.5" (1.74 m)  Wt 173 lb 12.8 oz (78.835 kg)  BMI 26.04 kg/m2  Wt Readings from Last 3 Encounters:  11/12/15 173 lb 12.8 oz (78.835 kg)  07/21/15 174 lb (78.926 kg)  07/13/15 172 lb (78.019 kg)    Physical Exam  Constitutional: He is oriented to person, place, and time. He appears well-developed and well-nourished. No distress.  HENT:  Head: Normocephalic and atraumatic.  Neck: Neck supple. No thyromegaly present.  Cardiovascular: Normal rate, regular rhythm and normal heart sounds.  Exam reveals no gallop and no friction rub.   No murmur heard. Pulmonary/Chest: Effort normal and breath sounds normal. He has no wheezes.  Abdominal: Soft. Bowel sounds are normal. He exhibits no distension. There is no tenderness. There is no rebound.  Musculoskeletal: Normal range of motion. He exhibits no edema or tenderness.  Neurological: He is alert and oriented to person, place, and time. He has normal reflexes.  Skin: Skin is warm and dry. No rash noted. No erythema.  Psychiatric: He has a normal mood and affect. His behavior is normal. Thought content normal.   Results for orders placed or performed in visit on 11/12/15  POCT HgB A1C  Result Value Ref Range   Hemoglobin A1C 6.6       Assessment & Plan:   Problem List Items Addressed This Visit      Endocrine   Type 2 diabetes mellitus with diabetic chronic kidney disease ([place]) -  Primary   Relevant Orders   POCT HgB A1C (Completed)      No orders of the defined types were placed in this encounter.      Follow up plan: No Follow-up on file.

## 2015-11-13 LAB — BASIC METABOLIC PANEL WITH GFR
BUN: 16 mg/dL (ref 7–25)
CHLORIDE: 106 mmol/L (ref 98–110)
CO2: 26 mmol/L (ref 20–31)
CREATININE: 1.22 mg/dL — AB (ref 0.70–1.18)
Calcium: 9.2 mg/dL (ref 8.6–10.3)
GFR, Est African American: 67 mL/min (ref >=60)
GFR, Est Non African American: 58 mL/min — ABNORMAL LOW (ref >=60)
GLUCOSE: 117 mg/dL — AB (ref 65–99)
POTASSIUM: 4.1 mmol/L (ref 3.5–5.3)
Sodium: 142 mmol/L (ref 135–146)

## 2015-11-17 ENCOUNTER — Encounter (INDEPENDENT_AMBULATORY_CARE_PROVIDER_SITE_OTHER): Payer: Self-pay

## 2015-11-19 ENCOUNTER — Telehealth: Payer: Self-pay | Admitting: *Deleted

## 2015-11-19 NOTE — Telephone Encounter (Signed)
Patient will wait until October to have lipid panel drawn.[place]

## 2015-11-19 NOTE — Telephone Encounter (Signed)
I did not check a lipid panel because I checked one earlier this year. He is not taking medication and it does not need to be checked more than once per year.  However, I am happy to check it if he would like it checked. I did mention we were only checking kidney function at his last visit, I apologize for not making it more clear that we were not doing cholesterol. If he would like to have it checked, he is welcome to come anytime to make an appt for labwork or we can check it at his next visit in October.   Thanks! AK

## 2015-11-19 NOTE — Telephone Encounter (Signed)
Pt is upset b/c his lipid panel was not checked. He says it has always been checked in the past. Please explain why it was not ordered this time.

## 2016-02-10 ENCOUNTER — Other Ambulatory Visit: Payer: PPO

## 2016-02-10 ENCOUNTER — Ambulatory Visit: Payer: PPO | Admitting: Family Medicine

## 2016-02-10 ENCOUNTER — Other Ambulatory Visit: Payer: Self-pay | Admitting: Family Medicine

## 2016-02-10 DIAGNOSIS — E785 Hyperlipidemia, unspecified: Secondary | ICD-10-CM | POA: Diagnosis not present

## 2016-02-10 DIAGNOSIS — E1169 Type 2 diabetes mellitus with other specified complication: Secondary | ICD-10-CM

## 2016-02-10 DIAGNOSIS — E1122 Type 2 diabetes mellitus with diabetic chronic kidney disease: Secondary | ICD-10-CM | POA: Diagnosis not present

## 2016-02-10 LAB — LIPID PANEL
CHOLESTEROL: 173 mg/dL (ref 125–200)
HDL: 42 mg/dL (ref >=40)
LDL Cholesterol: 108 mg/dL (ref <130)
TRIGLYCERIDES: 113 mg/dL (ref <150)
Total CHOL/HDL Ratio: 4.1 Ratio (ref <=5.0)
VLDL: 23 mg/dL (ref <30)

## 2016-02-10 LAB — BASIC METABOLIC PANEL WITH GFR
BUN: 14 mg/dL (ref 7–25)
CALCIUM: 9.4 mg/dL (ref 8.6–10.3)
CHLORIDE: 103 mmol/L (ref 98–110)
CO2: 32 mmol/L — ABNORMAL HIGH (ref 20–31)
CREATININE: 1.27 mg/dL — AB (ref 0.70–1.18)
GFR, EST AFRICAN AMERICAN: 63 mL/min (ref >=60)
GFR, Est Non African American: 55 mL/min — ABNORMAL LOW (ref >=60)
Glucose, Bld: 122 mg/dL — ABNORMAL HIGH (ref 65–99)
Potassium: 4.3 mmol/L (ref 3.5–5.3)
SODIUM: 141 mmol/L (ref 135–146)

## 2016-02-11 ENCOUNTER — Encounter: Payer: Self-pay | Admitting: Family Medicine

## 2016-02-11 ENCOUNTER — Ambulatory Visit (INDEPENDENT_AMBULATORY_CARE_PROVIDER_SITE_OTHER): Payer: PPO | Admitting: Family Medicine

## 2016-02-11 VITALS — BP 132/75 | HR 60 | Temp 97.7°F | Resp 16 | Ht 68.5 in | Wt 171.0 lb

## 2016-02-11 DIAGNOSIS — I10 Essential (primary) hypertension: Secondary | ICD-10-CM | POA: Diagnosis not present

## 2016-02-11 DIAGNOSIS — E785 Hyperlipidemia, unspecified: Secondary | ICD-10-CM | POA: Diagnosis not present

## 2016-02-11 DIAGNOSIS — E119 Type 2 diabetes mellitus without complications: Secondary | ICD-10-CM

## 2016-02-11 DIAGNOSIS — E1122 Type 2 diabetes mellitus with diabetic chronic kidney disease: Secondary | ICD-10-CM

## 2016-02-11 DIAGNOSIS — N182 Chronic kidney disease, stage 2 (mild): Secondary | ICD-10-CM

## 2016-02-11 DIAGNOSIS — Z23 Encounter for immunization: Secondary | ICD-10-CM | POA: Diagnosis not present

## 2016-02-11 DIAGNOSIS — E1169 Type 2 diabetes mellitus with other specified complication: Secondary | ICD-10-CM | POA: Diagnosis not present

## 2016-02-11 LAB — POCT GLYCOSYLATED HEMOGLOBIN (HGB A1C)

## 2016-02-11 MED ORDER — LISINOPRIL 10 MG PO TABS: 10.0000 mg | ORAL_TABLET | Freq: Every day | ORAL | 3 refills | Status: DC

## 2016-02-11 MED ORDER — GLIPIZIDE 5 MG PO TABS: 2.5000 mg | ORAL_TABLET | Freq: Every day | ORAL | 3 refills | Status: DC

## 2016-02-11 NOTE — Progress Notes (Signed)
Subjective:    Patient ID: Daniel Carney, male    DOB: 1940-09-12, 75 y.o.   MRN: [id number]  HPI: Daniel Carney is a 75 y.o. male presenting on 02/11/2016 for No chief complaint on file.   HPI  Pt presents for follow-up of diabetes. Checking sugars at home. Avg in the AM 80-100. Never feels low. No episodes hypoglycemia.  Eye exam complete on 06/15/2015. UA microablumin UTD. No numbness or tingling in his feet.  Chronic Kidney disease stage 2: Cr Baseline 1.2- pt is concerned about his creatinine. He previously saw a kidney specialist in [place]- is concerned he might need to go again. Void regularly. No change in quality of the urine.  Needs flu shot today.   Past Medical History:  Diagnosis Date  . Cancer [facility])    prostate  . Diabetes mellitus without complication ([place])   . Hypertension   . Incontinence     Current Outpatient Prescriptions on File Prior to Visit  Medication Sig  . aspirin EC 81 MG tablet Take 81 mg by mouth daily.  . Lancets MISC Please dispense One Touch Lancets. Check blood glucose once daily.  . Omega-3 Fatty Acids (FISH OIL) 1000 MG CAPS Take 1 capsule by mouth daily.   No current facility-administered medications on file prior to visit.     Review of Systems  Constitutional: Negative for chills and fever.  HENT: Negative.   Respiratory: Negative for chest tightness, shortness of breath and wheezing.   Cardiovascular: Negative for chest pain, palpitations and leg swelling.  Gastrointestinal: Negative for abdominal pain, nausea and vomiting.  Endocrine: Negative.   Genitourinary: Negative for discharge, dysuria, penile pain, testicular pain and urgency.  Musculoskeletal: Negative for arthralgias, back pain and joint swelling.  Skin: Negative.   Neurological: Negative for dizziness, weakness, numbness and headaches.  Psychiatric/Behavioral: Negative for dysphoric mood and sleep disturbance.   Per HPI unless specifically indicated above     Objective:      BP 132/75 (BP Location: Right Arm, Patient Position: Sitting, Cuff Size: Large)   Pulse 60   Temp 97.7 F (36.5 C) (Oral)   Resp 16   Ht 5' 8.5" (1.74 m)   Wt 171 lb (77.6 kg)   BMI 25.62 kg/m   Wt Readings from Last 3 Encounters:  02/11/16 171 lb (77.6 kg)  11/12/15 173 lb 12.8 oz (78.8 kg)  07/21/15 174 lb (78.9 kg)    Physical Exam  Constitutional: He is oriented to person, place, and time. He appears well-developed and well-nourished. No distress.  HENT:  Head: Normocephalic and atraumatic.  Neck: Neck supple. No thyromegaly present.  Cardiovascular: Normal rate, regular rhythm and normal heart sounds.  Exam reveals no gallop and no friction rub.   No murmur heard. Pulmonary/Chest: Effort normal and breath sounds normal. He has no wheezes.  Abdominal: Soft. Bowel sounds are normal. He exhibits no distension. There is no tenderness. There is no rebound.  Musculoskeletal: Normal range of motion. He exhibits no edema or tenderness.  Neurological: He is alert and oriented to person, place, and time. He has normal reflexes.  Skin: Skin is warm and dry. No rash noted. No erythema.  Psychiatric: He has a normal mood and affect. His behavior is normal. Thought content normal.   Diabetic Foot Exam - Simple   Simple Foot Form Diabetic Foot exam was performed with the following findings:  Yes 02/11/2016 11:11 AM  Visual Inspection No deformities, no ulcerations, no other skin breakdown bilaterally:  Yes Sensation Testing Intact to touch and monofilament testing bilaterally:  Yes Pulse Check Posterior Tibialis and Dorsalis pulse intact bilaterally:  Yes Comments     Results for orders placed or performed in visit on 02/11/16  POCT HgB A1C  Result Value Ref Range   Hemoglobin A1C 6.4%       Assessment & Plan:   Problem List Items Addressed This Visit      Cardiovascular and Mediastinum   Hypertension    Controlled. However due to pt concerns about Cr increasing over  time. Will trial reduced dose of lisinopril.  Continue to monitor carefully and BP recheck in 4 weeks.       Relevant Medications   lisinopril (PRINIVIL,ZESTRIL) 10 MG tablet     Endocrine   Type 2 diabetes mellitus with diabetic chronic kidney disease ([place])    Controlled at 6.4%- due low fasting sugars and concern about hypoglycemia- will plan to decrease glipizide to 2.5mg  once daily. Continue to monitor sugars.  Foot exam UTD Eye exam UTD Urine Microalbumin UTD and on ACE.  Recheck 3 mos.       Relevant Medications   glipiZIDE (GLUCOTROL) 5 MG tablet   lisinopril (PRINIVIL,ZESTRIL) 10 MG tablet   Hyperlipidemia associated with type 2 diabetes mellitus ([place])    Pt declines a statin 2/2 statin intolerance. His LDL is <100. Continue diet and lifestyle changes. ASA 81mg  for prevention.       Relevant Medications   glipiZIDE (GLUCOTROL) 5 MG tablet   lisinopril (PRINIVIL,ZESTRIL) 10 MG tablet     Genitourinary   Chronic kidney disease (CKD) stage G2/A2, mildly decreased glomerular filtration rate (GFR) between 60-89 mL/min/1.73 square meter and albuminuria creatinine ratio between 30-299 mg/g    CKD stage 2- GFR is 63. CR has been increasing over time. Discussed with patient a referral to nephrololgy to establish care. He would like to wait until the first of the year. Will adjust BP meds and check Cr in 4 weeks. Avoid NSAIDs.        Other Visit Diagnoses    Diabetes mellitus without complication ([place])    -  Primary   Relevant Medications   glipiZIDE (GLUCOTROL) 5 MG tablet   lisinopril (PRINIVIL,ZESTRIL) 10 MG tablet   Other Relevant Orders   POCT HgB A1C (Completed)   Need for influenza vaccination       Relevant Orders   Flu vaccine HIGH DOSE PF (Fluzone High dose) (Completed)      Meds ordered this encounter  Medications  . glipiZIDE (GLUCOTROL) 5 MG tablet    Sig: Take 0.5 tablets (2.5 mg total) by mouth daily before breakfast.    Dispense:  45 tablet    Refill:   3    Order Specific Question:   Supervising Provider    Answer:   Arlis Porta [phone]  . lisinopril (PRINIVIL,ZESTRIL) 10 MG tablet    Sig: Take 1 tablet (10 mg total) by mouth daily.    Dispense:  90 tablet    Refill:  3    Order Specific Question:   Supervising Provider    Answer:   Arlis Porta [id number]      Follow up plan: Return in about 4 weeks (around 03/10/2016), or if symptoms worsen or fail to improve.

## 2016-02-11 NOTE — Assessment & Plan Note (Signed)
Controlled. However due to pt concerns about Cr increasing over time. Will trial reduced dose of lisinopril.  Continue to monitor carefully and BP recheck in 4 weeks.

## 2016-02-11 NOTE — Patient Instructions (Addendum)
Please take 1/2 tablet only of the glucotrol daily.  Continue to check your blood sugars daily. Please let us know if they are trending up.  Please take 10mg  of Lisinopril daily. We will plan to follow-up in 1 mos on your blood pressure and kidney function.   Your goal blood pressure is 140/90. Work on low salt/sodium diet - goal <1.5gm (1,500mg ) per day. Eat a diet high in fruits/vegetables and whole grains.  Look into mediterranean and DASH diet. Goal activity is 12min/wk of moderate intensity exercise.  This can be split into 30 minute chunks.  If you are not at this level, you can start with smaller 10-15 min increments and slowly build up activity. Look at [employer] for more resources  Please seek immediate medical attention at ER or Urgent Care if you develop: Chest pain, pressure or tightness. Shortness of breath accompanied by nausea or diaphoresis Visual changes Numbness or tingling on one side of the body Facial droop Altered mental status Or any concerning symptoms.

## 2016-02-11 NOTE — Assessment & Plan Note (Signed)
Controlled at 6.4%- due low fasting sugars and concern about hypoglycemia- will plan to decrease glipizide to 2.5mg  once daily. Continue to monitor sugars.  Foot exam UTD Eye exam UTD Urine Microalbumin UTD and on ACE.  Recheck 3 mos.

## 2016-02-11 NOTE — Assessment & Plan Note (Signed)
Pt declines a statin 2/2 statin intolerance. His LDL is <100. Continue diet and lifestyle changes. ASA 81mg  for prevention.

## 2016-02-11 NOTE — Assessment & Plan Note (Signed)
CKD stage 2- GFR is 63. CR has been increasing over time. Discussed with patient a referral to nephrololgy to establish care. He would like to wait until the first of the year. Will adjust BP meds and check Cr in 4 weeks. Avoid NSAIDs.

## 2016-03-10 ENCOUNTER — Telehealth: Payer: Self-pay | Admitting: Family Medicine

## 2016-03-10 ENCOUNTER — Other Ambulatory Visit: Payer: Self-pay | Admitting: *Deleted

## 2016-03-10 MED ORDER — GLUCOSE BLOOD VI STRP: ORAL_STRIP | 12 refills | Status: DC

## 2016-03-10 NOTE — Telephone Encounter (Signed)
Pt needs a refill of diabetic test strips sent to [facility].  Pt moved so he changed pharmacies.  His call back number is [phone]

## 2016-03-10 NOTE — Telephone Encounter (Signed)
done

## 2016-03-13 ENCOUNTER — Other Ambulatory Visit: Payer: Self-pay | Admitting: *Deleted

## 2016-03-13 MED ORDER — GLUCOSE BLOOD VI STRP: ORAL_STRIP | 12 refills | Status: DC

## 2016-05-05 ENCOUNTER — Encounter: Payer: Self-pay | Admitting: Family Medicine

## 2016-05-05 ENCOUNTER — Ambulatory Visit (INDEPENDENT_AMBULATORY_CARE_PROVIDER_SITE_OTHER): Payer: PPO | Admitting: Family Medicine

## 2016-05-05 VITALS — BP 126/70 | HR 64 | Temp 97.5°F | Resp 12 | Ht 67.0 in | Wt 166.6 lb

## 2016-05-05 DIAGNOSIS — I10 Essential (primary) hypertension: Secondary | ICD-10-CM

## 2016-05-05 DIAGNOSIS — Z8546 Personal history of malignant neoplasm of prostate: Secondary | ICD-10-CM | POA: Diagnosis not present

## 2016-05-05 DIAGNOSIS — E1169 Type 2 diabetes mellitus with other specified complication: Secondary | ICD-10-CM | POA: Diagnosis not present

## 2016-05-05 DIAGNOSIS — E1122 Type 2 diabetes mellitus with diabetic chronic kidney disease: Secondary | ICD-10-CM

## 2016-05-05 DIAGNOSIS — E785 Hyperlipidemia, unspecified: Secondary | ICD-10-CM

## 2016-05-05 LAB — PSA: PSA: 0.01 ng/mL — AB (ref 0.10–4.00)

## 2016-05-05 LAB — HEMOGLOBIN A1C: Hgb A1c MFr Bld: 7.4 % — ABNORMAL HIGH (ref 4.6–6.5)

## 2016-05-05 MED ORDER — GLIPIZIDE 5 MG PO TABS
5.0000 mg | ORAL_TABLET | Freq: Every day | ORAL | 1 refills | Status: DC
Start: 2016-05-05 — End: 2016-08-17

## 2016-05-05 NOTE — Progress Notes (Signed)
Subjective:  Patient ID: Daniel Carney, male    DOB: 1940-11-21  Age: 76 y.o. MRN: [id number]  CC: Establish care  HPI Daniel Carney is a 76 y.o. male presents to the clinic today to establish care.  Hypertension  At goal on lisinopril.  DM 2  Well controlled. Is not on metformin and I'm not sure why.  Patient is not sure either.  He is currently on glipizide 2.5 mg daily. This was reduced from 5 mg given low fastings and concern for hypoglycemia.  Patient states that he has had no recent hyperglycemia.  His sugars have been trending up. They have been in the 1 teens.  He would like to resume glipizide 5 mg daily.  Hyperlipidemia  Currently uncontrolled/not at goal.  Last LDL was 108.  Patient appears to have refused statins in the past. Prior notes report intolerance. However, patient states that he tolerates these medications he just does not want to be on medication if he does not have to.  We'll discuss statin treatment today.  PMH, Surgical Hx, Family Hx, Social History reviewed and updated as below.  Past Medical History:  Diagnosis Date  . Cancer [facility])    prostate  . Diabetes mellitus without complication ([place])   . Hyperlipidemia   . Hypertension    Past Surgical History:  Procedure Laterality Date  . PROSTATE SURGERY  2004   Family History  Problem Relation Age of Onset  . Diabetes Mother   . Kidney disease Mother   . Hypertension Mother    Social History  Substance Use Topics  . Smoking status: Never Smoker  . Smokeless tobacco: Never Used  . Alcohol use No   Review of Systems  HENT: Positive for voice change.   Genitourinary:       Urinary incontinence.  All other systems reviewed and are negative.   Objective:   Today's Vitals: BP 126/70   Pulse 64   Temp 97.5 F (36.4 C) (Oral)   Resp 12   Ht 5\' 7"  (1.702 m)   Wt 166 lb 9.6 oz (75.6 kg)   SpO2 100%   BMI 26.09 kg/m   Physical Exam  Constitutional: He is oriented to  person, place, and time. He appears well-developed and well-nourished. No distress.  HENT:  Head: Normocephalic and atraumatic.  Nose: Nose normal.  Mouth/Throat: Oropharynx is clear and moist. No oropharyngeal exudate.  Normal TM's bilaterally.   Eyes: Conjunctivae are normal. No scleral icterus.  Neck: Neck supple.  Cardiovascular: Normal rate and regular rhythm.   2/6 systolic murmur.  Pulmonary/Chest: Effort normal and breath sounds normal. He has no wheezes. He has no rales.  Abdominal: Soft. He exhibits no distension. There is no tenderness. There is no rebound and no guarding.  Musculoskeletal: Normal range of motion. He exhibits no edema.  Lymphadenopathy:    He has no cervical adenopathy.  Neurological: He is alert and oriented to person, place, and time.  Skin:  Large lipoma noted on the Right upper back.  Psychiatric: He has a normal mood and affect.  Vitals reviewed.  Assessment & Plan:   Problem List Items Addressed This Visit    Type 2 diabetes mellitus with diabetic chronic kidney disease ([place])    At goal. Will increase glipizide to 5 mg daily per patient preference. Advised to be cautious regarding low sugars. A1c today.      Relevant Medications   glipiZIDE (GLUCOTROL) 5 MG tablet   Other Relevant Orders  Hemoglobin A1c   Hypertension - Primary    At goal. Continue lisinopril.      Hyperlipidemia associated with type 2 diabetes mellitus ([facility])    Uncontrolled. Discussed treatment with statin. Patient will consider. Will continue to discuss at follow up. Patient does exercise regularly. He will continue this.      Relevant Medications   glipiZIDE (GLUCOTROL) 5 MG tablet    Other Visit Diagnoses    History of prostate cancer       Relevant Orders   PSA      Outpatient Encounter Prescriptions as of 05/05/2016  Medication Sig  . aspirin EC 81 MG tablet Take 81 mg by mouth daily.  Marland Kitchen glipiZIDE (GLUCOTROL) 5 MG tablet Take 1 tablet (5 mg total) by  mouth daily before breakfast.  . glucose blood test strip Check blood sugar twice daily. Dispense as One Touch Ultra  . Lancets MISC Please dispense One Touch Lancets. Check blood glucose once daily.  Marland Kitchen lisinopril (PRINIVIL,ZESTRIL) 10 MG tablet Take 1 tablet (10 mg total) by mouth daily.  . Omega-3 Fatty Acids (FISH OIL) 1000 MG CAPS Take 1 capsule by mouth daily.  . [DISCONTINUED] glipiZIDE (GLUCOTROL) 5 MG tablet Take 0.5 tablets (2.5 mg total) by mouth daily before breakfast.   No facility-administered encounter medications on file as of 05/05/2016.     Follow-up: Return in about 6 months (around 11/02/2016).  [place]

## 2016-05-05 NOTE — Assessment & Plan Note (Signed)
At goal. Will increase glipizide to 5 mg daily per patient preference. Advised to be cautious regarding low sugars. A1c today.

## 2016-05-05 NOTE — Assessment & Plan Note (Addendum)
Uncontrolled. Discussed treatment with statin. Patient will consider. Will continue to discuss at follow up. Patient does exercise regularly. He will continue this.

## 2016-05-05 NOTE — Assessment & Plan Note (Signed)
At goal. Continue lisinopril.  

## 2016-05-05 NOTE — Progress Notes (Signed)
Pre visit review using our clinic review tool, if applicable. No additional management support is needed unless otherwise documented below in the visit note. 

## 2016-05-05 NOTE — Patient Instructions (Addendum)
Labs today.  I refilled your Glipizide.  Watch for low sugars.  Consider statin.  Follow up 6 months.  Take care  Dr. Lacinda Axon

## 2016-05-19 ENCOUNTER — Telehealth: Payer: Self-pay | Admitting: *Deleted

## 2016-05-19 NOTE — Telephone Encounter (Signed)
Pt advised of his results.

## 2016-05-19 NOTE — Telephone Encounter (Signed)
Pt requested lab results  Pt contact [phone]

## 2016-06-23 ENCOUNTER — Telehealth: Payer: Self-pay | Admitting: *Deleted

## 2016-06-23 MED ORDER — BLOOD GLUCOSE METER KIT: PACK | 0 refills | Status: DC

## 2016-06-23 NOTE — Telephone Encounter (Signed)
rx printed and faxed

## 2016-06-23 NOTE — Telephone Encounter (Signed)
Pt requested to have a glucose meter one touch ultra 2 and test strips. Pharmacy [facility] [place]

## 2016-06-28 DIAGNOSIS — E089 Diabetes mellitus due to underlying condition without complications: Secondary | ICD-10-CM | POA: Diagnosis not present

## 2016-06-28 DIAGNOSIS — H2513 Age-related nuclear cataract, bilateral: Secondary | ICD-10-CM | POA: Diagnosis not present

## 2016-06-28 DIAGNOSIS — H11042 Peripheral pterygium, stationary, left eye: Secondary | ICD-10-CM | POA: Diagnosis not present

## 2016-06-28 DIAGNOSIS — E119 Type 2 diabetes mellitus without complications: Secondary | ICD-10-CM | POA: Diagnosis not present

## 2016-07-15 ENCOUNTER — Other Ambulatory Visit: Payer: Self-pay | Admitting: Family Medicine

## 2016-08-17 ENCOUNTER — Ambulatory Visit (INDEPENDENT_AMBULATORY_CARE_PROVIDER_SITE_OTHER): Payer: PPO | Admitting: Family Medicine

## 2016-08-17 ENCOUNTER — Encounter: Payer: Self-pay | Admitting: Family Medicine

## 2016-08-17 VITALS — BP 120/80 | HR 67 | Temp 98.2°F | Wt 172.5 lb

## 2016-08-17 DIAGNOSIS — I10 Essential (primary) hypertension: Secondary | ICD-10-CM | POA: Diagnosis not present

## 2016-08-17 DIAGNOSIS — N182 Chronic kidney disease, stage 2 (mild): Secondary | ICD-10-CM

## 2016-08-17 DIAGNOSIS — E785 Hyperlipidemia, unspecified: Secondary | ICD-10-CM | POA: Diagnosis not present

## 2016-08-17 DIAGNOSIS — E1122 Type 2 diabetes mellitus with diabetic chronic kidney disease: Secondary | ICD-10-CM

## 2016-08-17 DIAGNOSIS — E1169 Type 2 diabetes mellitus with other specified complication: Secondary | ICD-10-CM

## 2016-08-17 LAB — CBC
HCT: 43 % (ref 39.0–52.0)
Hemoglobin: 13.9 g/dL (ref 13.0–17.0)
MCHC: 32.4 g/dL (ref 30.0–36.0)
MCV: 83.1 fl (ref 78.0–100.0)
Platelets: 130 10*3/uL — ABNORMAL LOW (ref 150.0–400.0)
RBC: 5.17 Mil/uL (ref 4.22–5.81)
RDW: 13.6 % (ref 11.5–15.5)
WBC: 5.4 10*3/uL (ref 4.0–10.5)

## 2016-08-17 LAB — COMPREHENSIVE METABOLIC PANEL
ALBUMIN: 4.1 g/dL (ref 3.5–5.2)
ALT: 16 U/L (ref 0–53)
AST: 21 U/L (ref 0–37)
Alkaline Phosphatase: 79 U/L (ref 39–117)
BUN: 11 mg/dL (ref 6–23)
CO2: 31 meq/L (ref 19–32)
Calcium: 9.6 mg/dL (ref 8.4–10.5)
Chloride: 105 mEq/L (ref 96–112)
Creatinine, Ser: 1.16 mg/dL (ref 0.40–1.50)
GFR: 78.61 mL/min (ref >60.00)
GLUCOSE: 120 mg/dL — AB (ref 70–99)
POTASSIUM: 3.8 meq/L (ref 3.5–5.1)
SODIUM: 140 meq/L (ref 135–145)
Total Bilirubin: 0.6 mg/dL (ref 0.2–1.2)
Total Protein: 7.7 g/dL (ref 6.0–8.3)

## 2016-08-17 LAB — LIPID PANEL
CHOL/HDL RATIO: 4
Cholesterol: 187 mg/dL (ref 0–200)
HDL: 42.1 mg/dL (ref >39.00)
LDL Cholesterol: 125 mg/dL — ABNORMAL HIGH (ref 0–99)
NONHDL: 145.11
Triglycerides: 102 mg/dL (ref 0.0–149.0)
VLDL: 20.4 mg/dL (ref 0.0–40.0)

## 2016-08-17 LAB — HEMOGLOBIN A1C: Hgb A1c MFr Bld: 7.4 % — ABNORMAL HIGH (ref 4.6–6.5)

## 2016-08-17 MED ORDER — LISINOPRIL 10 MG PO TABS: 10.0000 mg | ORAL_TABLET | Freq: Every day | ORAL | 3 refills | Status: DC

## 2016-08-17 MED ORDER — GLIPIZIDE 5 MG PO TABS: 5.0000 mg | ORAL_TABLET | Freq: Every day | ORAL | 1 refills | Status: DC

## 2016-08-17 NOTE — Progress Notes (Signed)
Pre visit review using our clinic review tool, if applicable. No additional management support is needed unless otherwise documented below in the visit note. 

## 2016-08-17 NOTE — Patient Instructions (Signed)
We will call with your lab results and go from there.  Follow up in 3 months  Take care  Dr. Lacinda Axon

## 2016-08-17 NOTE — Assessment & Plan Note (Signed)
Lipid panel today.  Advocated for statin. Patient to consider.

## 2016-08-17 NOTE — Progress Notes (Signed)
Subjective:  Patient ID: Daniel Carney, male    DOB: Jan 20, 1941  Age: 76 y.o. MRN: [id number]  CC: Follow up  HPI:  76 year old male with Hypertension, DM 2, HLD, CKD presents for follow up.  Hypertension  At goal on lisinopril.   HLD  Not at goal.  Needs statin.  Needs labs today.  DM-2  Sugars running in the 150's.  Only on Glipizide. Refused metformin previously.  Needs A1C today.  Social Hx   Social History   Social History  . Marital status: Married    Spouse name: N/A  . Number of children: N/A  . Years of education: N/A   Social History Main Topics  . Smoking status: Never Smoker  . Smokeless tobacco: Never Used  . Alcohol use No  . Drug use: No  . Sexual activity: Not Currently    Partners: Female   Other Topics Concern  . None   Social History Narrative  . None    Review of Systems  Constitutional: Negative.   Respiratory: Negative.   Cardiovascular: Negative.    Objective:  BP 120/80   Pulse 67   Temp 98.2 F (36.8 C) (Oral)   Wt 172 lb 8 oz (78.2 kg)   SpO2 97%   BMI 27.02 kg/m   BP/Weight 08/17/2016 05/05/2016 01/14/3006  Systolic BP 622 633 354  Diastolic BP 80 70 75  Wt. (Lbs) 172.5 166.6 171  BMI 27.02 26.09 25.62    Physical Exam  Constitutional: He is oriented to person, place, and time. He appears well-developed. No distress.  Cardiovascular: Normal rate and regular rhythm.   Murmur heard. Pulmonary/Chest: Effort normal and breath sounds normal.  Neurological: He is alert and oriented to person, place, and time.  Psychiatric: He has a normal mood and affect.  Vitals reviewed.   Lab Results  Component Value Date   WBC 6.0 12/22/2014   HGB 13.3 12/22/2014   HCT 41.8 12/22/2014   PLT 115 (L) 12/22/2014   GLUCOSE 122 (H) 02/10/2016   CHOL 173 02/10/2016   TRIG 113 02/10/2016   HDL 42 02/10/2016   LDLCALC 108 02/10/2016   ALT 25 04/30/2015   AST 21 04/30/2015   NA 141 02/10/2016   K 4.3 02/10/2016   CL  103 02/10/2016   CREATININE 1.27 (H) 02/10/2016   BUN 14 02/10/2016   CO2 32 (H) 02/10/2016   PSA 0.01 (L) 05/05/2016   HGBA1C 7.4 (H) 05/05/2016   MICROALBUR 50 04/29/2015    Assessment & Plan:   Problem List Items Addressed This Visit    Type 2 diabetes mellitus with diabetic chronic kidney disease ([place])    Likely uncontrolled/not at goal. A1C today. Continue glipizide while awaiting results of A1C.       Relevant Medications   lisinopril (PRINIVIL,ZESTRIL) 10 MG tablet   glipiZIDE (GLUCOTROL) 5 MG tablet   Other Relevant Orders   Hemoglobin A1c   Comprehensive metabolic panel   Hypertension - Primary    At goal. Continue lisinopril.       Relevant Medications   lisinopril (PRINIVIL,ZESTRIL) 10 MG tablet   Other Relevant Orders   Lipid panel   Hyperlipidemia associated with type 2 diabetes mellitus ([place])    Lipid panel today.  Advocated for statin. Patient to consider.       Relevant Medications   lisinopril (PRINIVIL,ZESTRIL) 10 MG tablet   glipiZIDE (GLUCOTROL) 5 MG tablet   CKD stage G2/A1, GFR 60-89 and albumin creatinine ratio <  30 mg/g   Relevant Orders   CBC      Meds ordered this encounter  Medications  . lisinopril (PRINIVIL,ZESTRIL) 10 MG tablet    Sig: Take 1 tablet (10 mg total) by mouth daily.    Dispense:  90 tablet    Refill:  3  . glipiZIDE (GLUCOTROL) 5 MG tablet    Sig: Take 1 tablet (5 mg total) by mouth daily before breakfast.    Dispense:  90 tablet    Refill:  1   Follow-up: 3 months  [place] DO [facility]

## 2016-08-17 NOTE — Assessment & Plan Note (Signed)
At goal. Continue lisinopril.  

## 2016-08-17 NOTE — Assessment & Plan Note (Signed)
Likely uncontrolled/not at goal. A1C today. Continue glipizide while awaiting results of A1C.

## 2016-08-18 ENCOUNTER — Other Ambulatory Visit: Payer: Self-pay | Admitting: Family Medicine

## 2016-08-22 ENCOUNTER — Other Ambulatory Visit: Payer: Self-pay | Admitting: Family Medicine

## 2016-08-22 MED ORDER — METFORMIN HCL 500 MG PO TABS: 500.0000 mg | ORAL_TABLET | Freq: Two times a day (BID) | ORAL | 3 refills | Status: DC

## 2016-11-05 ENCOUNTER — Other Ambulatory Visit: Payer: Self-pay | Admitting: Family Medicine

## 2016-11-05 DIAGNOSIS — E1122 Type 2 diabetes mellitus with diabetic chronic kidney disease: Secondary | ICD-10-CM

## 2016-11-13 ENCOUNTER — Other Ambulatory Visit: Payer: Self-pay | Admitting: Family Medicine

## 2016-11-13 ENCOUNTER — Telehealth: Payer: Self-pay | Admitting: Family Medicine

## 2016-11-13 NOTE — Telephone Encounter (Signed)
Pt called and is requesting a change to the one touch libre. Please advise, thank you!  Pharmacy [id number] [place] [place]  Call pt @ [phone]

## 2016-11-13 NOTE — Telephone Encounter (Signed)
Pt wants the freestyle libre.

## 2016-11-13 NOTE — Telephone Encounter (Signed)
Is he referring to the freestyle libre?

## 2016-11-13 NOTE — Telephone Encounter (Signed)
Left voice mail to call back 

## 2016-11-15 ENCOUNTER — Other Ambulatory Visit: Payer: Self-pay | Admitting: Family Medicine

## 2016-11-15 MED ORDER — FREESTYLE LIBRE SENSOR SYSTEM MISC: 0 refills | Status: DC

## 2016-11-15 MED ORDER — FREESTYLE LIBRE READER DEVI: 1.0000 | Freq: Every day | 0 refills | Status: DC

## 2016-11-15 NOTE — Telephone Encounter (Signed)
Sent!

## 2016-11-17 MED ORDER — FREESTYLE LIBRE SENSOR SYSTEM MISC: 6 refills | Status: DC

## 2016-11-17 NOTE — Telephone Encounter (Signed)
Script sent  

## 2016-11-17 NOTE — Telephone Encounter (Signed)
Can you send in 3 month supply or will insurance pay for or do I need to clarify with pharmacy?

## 2016-11-17 NOTE — Telephone Encounter (Signed)
Okay with 3 month supply.

## 2016-11-17 NOTE — Telephone Encounter (Signed)
[facility] to have the freestyle [place] sent over for 3 FYI -this will give the patient a months supply. Each last for about 10 days

## 2016-11-21 ENCOUNTER — Ambulatory Visit: Payer: PPO | Admitting: Family Medicine

## 2016-11-23 ENCOUNTER — Ambulatory Visit (INDEPENDENT_AMBULATORY_CARE_PROVIDER_SITE_OTHER): Payer: PPO | Admitting: Family Medicine

## 2016-11-23 ENCOUNTER — Encounter: Payer: Self-pay | Admitting: Family Medicine

## 2016-11-23 DIAGNOSIS — E1122 Type 2 diabetes mellitus with diabetic chronic kidney disease: Secondary | ICD-10-CM

## 2016-11-23 DIAGNOSIS — I10 Essential (primary) hypertension: Secondary | ICD-10-CM

## 2016-11-23 LAB — POCT GLYCOSYLATED HEMOGLOBIN (HGB A1C): Hemoglobin A1C: 6.4

## 2016-11-23 NOTE — Progress Notes (Signed)
   Subjective:  Patient ID: Daniel Carney, male    DOB: 06-26-1940  Age: 76 y.o. MRN: [id number]  CC: Follow up  HPI:  76 year old male with DM 2, hypertension, hyperlipidemia presents for follow-up.  Hypertension  At goal on lisinopril.  DM 2  Needs A1c today.  Endorses compliance with glipizide and metformin.  No side effects. No hypoglycemia.  Blood sugars ranging from the 90s - 120.  Social Hx   Social History   Social History  . Marital status: Married    Spouse name: N/A  . Number of children: N/A  . Years of education: N/A   Social History Main Topics  . Smoking status: Never Smoker  . Smokeless tobacco: Never Used  . Alcohol use No  . Drug use: No  . Sexual activity: Not Currently    Partners: Female   Other Topics Concern  . None   Social History Narrative  . None    Review of Systems  Constitutional: Negative.   Endocrine: Negative.    Objective:  BP 120/76 (BP Location: Right Arm, Patient Position: Sitting, Cuff Size: Normal)   Pulse 63   Temp 98.2 F (36.8 C) (Oral)   Resp 12   Wt 175 lb 6 oz (79.5 kg)   SpO2 96%   BMI 27.47 kg/m   BP/Weight 11/23/2016 08/17/2016 2/35/3614  Systolic BP 431 540 086  Diastolic BP 76 80 70  Wt. (Lbs) 175.38 172.5 166.6  BMI 27.47 27.02 26.09    Physical Exam  Constitutional: He is oriented to person, place, and time. He appears well-developed. No distress.  HENT:  Head: Normocephalic and atraumatic.  Eyes: Conjunctivae are normal. No scleral icterus.  Cardiovascular: Normal rate and regular rhythm.   2/6 systolic murmur.  Pulmonary/Chest: Effort normal. He has no wheezes. He has no rales.  Neurological: He is alert and oriented to person, place, and time.  Psychiatric: He has a normal mood and affect.  Vitals reviewed.   Lab Results  Component Value Date   WBC 5.4 08/17/2016   HGB 13.9 08/17/2016   HCT 43.0 08/17/2016   PLT 130.0 (L) 08/17/2016   GLUCOSE 120 (H) 08/17/2016   CHOL 187  08/17/2016   TRIG 102.0 08/17/2016   HDL 42.10 08/17/2016   LDLCALC 125 (H) 08/17/2016   ALT 16 08/17/2016   AST 21 08/17/2016   NA 140 08/17/2016   K 3.8 08/17/2016   CL 105 08/17/2016   CREATININE 1.16 08/17/2016   BUN 11 08/17/2016   CO2 31 08/17/2016   PSA 0.01 (L) 05/05/2016   HGBA1C 6.4 11/23/2016   MICROALBUR 50 04/29/2015    Assessment & Plan:   Problem List Items Addressed This Visit    Hypertension    Stable on lisinopril. Continue.      Type 2 diabetes mellitus with diabetic chronic kidney disease (HCC)    A1c 6.4 today. Improved. Doing well at this time. Continue glipizide and metformin.      Relevant Orders   POCT glycosylated hemoglobin (Hb A1C) (Completed)      Follow-up: No Follow-up on file.  [place]

## 2016-11-23 NOTE — Assessment & Plan Note (Signed)
Stable on lisinopril. Continue.

## 2016-11-23 NOTE — Assessment & Plan Note (Signed)
A1c 6.4 today. Improved. Doing well at this time. Continue glipizide and metformin.

## 2016-11-23 NOTE — Patient Instructions (Signed)
Continue your meds.  Follow up in 6 months.  Take care  Dr. Deyton Ellenbecker  

## 2016-12-18 ENCOUNTER — Other Ambulatory Visit: Payer: Self-pay | Admitting: Family Medicine

## 2016-12-20 ENCOUNTER — Telehealth: Payer: Self-pay | Admitting: Family Medicine

## 2016-12-20 NOTE — Telephone Encounter (Signed)
No answer/no vm when I called pt to schedule AWV. Will try again later  *NOTE* No hx of AWV  Ebony Hail call back [phone]

## 2017-01-25 NOTE — Telephone Encounter (Signed)
No answer/no vm when I called pt to schedule AWV.   *NOTE* No hx of AWV  Ebony Hail call back [phone]

## 2017-02-07 ENCOUNTER — Ambulatory Visit (INDEPENDENT_AMBULATORY_CARE_PROVIDER_SITE_OTHER): Payer: PPO | Admitting: Family

## 2017-02-07 ENCOUNTER — Encounter: Payer: Self-pay | Admitting: Family

## 2017-02-07 ENCOUNTER — Encounter: Payer: Self-pay | Admitting: General Surgery

## 2017-02-07 VITALS — BP 130/90 | HR 67 | Temp 98.2°F | Wt 171.1 lb

## 2017-02-07 DIAGNOSIS — R229 Localized swelling, mass and lump, unspecified: Secondary | ICD-10-CM | POA: Diagnosis not present

## 2017-02-07 NOTE — Progress Notes (Signed)
Subjective:    Patient ID: Daniel Carney, male    DOB: [birth year], 76 y.o.   MRN: [id number]  CC: Daniel Carney is a 76 y.o. male who presents today for an acute visit.    HPI: DT:OIZT upper back for 6 years, non tender. Increased in size. No drainage. No numbness, tingling. No injury. No HA, vision changes,  Feels some upper back pain x 2-3 weeks, unchanged. Not sure if related to 'lump in back.' No neck pain.   hasnt tried any medications.          HISTORY:  Past Medical History:  Diagnosis Date  . Cancer [facility])    prostate  . Diabetes mellitus without complication ([place])   . Hyperlipidemia   . Hypertension   . Type 2 diabetes mellitus with diabetic chronic kidney disease ([place]) 10/23/2014   Past Surgical History:  Procedure Laterality Date  . PROSTATE SURGERY  2004   Family History  Problem Relation Age of Onset  . Diabetes Mother   . Kidney disease Mother   . Hypertension Mother     Allergies: Patient has no known allergies. Current Outpatient Prescriptions on File Prior to Visit  Medication Sig Dispense Refill  . aspirin EC 81 MG tablet Take 81 mg by mouth daily.    Marland Kitchen glipiZIDE (GLUCOTROL) 5 MG tablet TAKE 1 TABLET (5 MG TOTAL) BY MOUTH DAILY BEFORE BREAKFAST. 90 tablet 1  . Lancets MISC Please dispense One Touch Lancets. Check blood glucose once daily. 100 each 11  . lisinopril (PRINIVIL,ZESTRIL) 10 MG tablet Take 1 tablet (10 mg total) by mouth daily. 90 tablet 3  . metFORMIN (GLUCOPHAGE) 500 MG tablet Take 1 tablet (500 mg total) by mouth 2 (two) times daily with a meal. 180 tablet 3  . Omega-3 Fatty Acids (FISH OIL) 1000 MG CAPS Take 1 capsule by mouth daily.    . ONE TOUCH ULTRA TEST test strip USE UP TO 4 TIMES DAILY AS DIRECTED 100 each 0   No current facility-administered medications on file prior to visit.     Social History  Substance Use Topics  . Smoking status: Never Smoker  . Smokeless tobacco: Never Used  . Alcohol use No    Review of  Systems  Constitutional: Negative for chills and fever.  Respiratory: Negative for cough.   Cardiovascular: Negative for chest pain and palpitations.  Gastrointestinal: Negative for nausea and vomiting.  Musculoskeletal: Negative for back pain, neck pain and neck stiffness.  Skin: Negative for rash and wound.      Objective:    There were no vitals taken for this visit.   Physical Exam  Constitutional: He appears well-developed and well-nourished.  Neck: Normal range of motion and full passive range of motion without pain. Neck supple. No spinous process tenderness and no muscular tenderness present.    approx 5-6 cm in diameter well circumscribed soft nontender mass noted posterior back visit on diagram. Nonfluctuant. No erythema or increased warmth.  Cardiovascular: Regular rhythm and normal heart sounds.   Pulmonary/Chest: Effort normal and breath sounds normal. No respiratory distress. He has no wheezes. He has no rhonchi. He has no rales.  Neurological: He is alert.  Skin: Skin is warm and dry.  Psychiatric: He has a normal mood and affect. His speech is normal and behavior is normal.  Vitals reviewed.      Assessment & Plan:   1. Localized superficial mass Due to large size and having getting bigger, referral to general surgery  for evaluation and possibly surgical removal. Return precautions given.   - Ambulatory referral to General Surgery    I am having Mr. Anastacio maintain his aspirin EC, Fish Oil, Lancets, lisinopril, metFORMIN, glipiZIDE, and ONE TOUCH ULTRA TEST.   No orders of the defined types were placed in this encounter.   Return precautions given.   Risks, benefits, and alternatives of the medications and treatment plan prescribed today were discussed, and patient expressed understanding.   Education regarding symptom management and diagnosis given to patient on AVS.  Continue to follow with Coral Spikes, DO for routine health maintenance.    Daniel Carney and I agreed with plan.   Mable Paris, FNP

## 2017-02-07 NOTE — Patient Instructions (Signed)
Lets consult general surgery for the mass on your upper back  In meantime, may try heat and topical therapy for pain relief- Aspercreme, BenGay etc.    If there is no improvement in your symptoms, or if there is any worsening of symptoms, or if you have any additional concerns, please return for re-evaluation; or, if we are closed, consider going to the Emergency Room for evaluation if symptoms urgent.

## 2017-02-15 ENCOUNTER — Encounter: Payer: Self-pay | Admitting: *Deleted

## 2017-02-19 ENCOUNTER — Ambulatory Visit: Payer: Self-pay | Admitting: General Surgery

## 2017-02-21 ENCOUNTER — Ambulatory Visit: Payer: Self-pay | Admitting: General Surgery

## 2017-03-07 ENCOUNTER — Ambulatory Visit (INDEPENDENT_AMBULATORY_CARE_PROVIDER_SITE_OTHER): Payer: PPO | Admitting: General Surgery

## 2017-03-07 ENCOUNTER — Encounter: Payer: Self-pay | Admitting: General Surgery

## 2017-03-07 VITALS — BP 128/82 | HR 76 | Resp 11 | Ht 68.5 in | Wt 175.0 lb

## 2017-03-07 DIAGNOSIS — L729 Follicular cyst of the skin and subcutaneous tissue, unspecified: Secondary | ICD-10-CM

## 2017-03-07 DIAGNOSIS — D171 Benign lipomatous neoplasm of skin and subcutaneous tissue of trunk: Secondary | ICD-10-CM | POA: Insufficient documentation

## 2017-03-07 NOTE — Progress Notes (Signed)
Patient ID: Daniel Carney, male   DOB: 04/09/1941, 76 y.o.   MRN: [id number]  Chief Complaint  Patient presents with  . Other    HPI Daniel Carney is a 76 y.o. male.  Here today for evaluation of a upper back mass referred by Mable Paris FNP. He states it has been there for at least 5-6 years. He thinks it is getting larger. Denies pain. He states some mild discomfort at gym when lying on his back lifting weights. Average blood glucose 90's.  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  HPI  Past Medical History:  Diagnosis Date  . Cancer [facility])    prostate  . Diabetes mellitus without complication ([place])   . Hyperlipidemia   . Hypertension   . Type 2 diabetes mellitus with diabetic chronic kidney disease ([place]) 2007    Past Surgical History:  Procedure Laterality Date  . PROSTATE SURGERY  2004    Family History  Problem Relation Age of Onset  . Diabetes Mother   . Kidney disease Mother   . Hypertension Mother     Social History Social History   Tobacco Use  . Smoking status: Never Smoker  . Smokeless tobacco: Never Used  Substance Use Topics  . Alcohol use: No    Alcohol/week: 0.0 oz  . Drug use: No    No Known Allergies  Current Outpatient Medications  Medication Sig Dispense Refill  . aspirin EC 81 MG tablet Take 81 mg by mouth daily.    Marland Kitchen glipiZIDE (GLUCOTROL) 5 MG tablet TAKE 1 TABLET (5 MG TOTAL) BY MOUTH DAILY BEFORE BREAKFAST. 90 tablet 1  . Lancets MISC Please dispense One Touch Lancets. Check blood glucose once daily. 100 each 11  . lisinopril (PRINIVIL,ZESTRIL) 10 MG tablet Take 1 tablet (10 mg total) by mouth daily. 90 tablet 3  . metFORMIN (GLUCOPHAGE) 500 MG tablet Take 1 tablet (500 mg total) by mouth 2 (two) times daily with a meal. 180 tablet 3  . Omega-3 Fatty Acids (FISH OIL) 1000 MG CAPS Take 1 capsule by mouth daily.    . ONE TOUCH ULTRA TEST test strip USE UP TO 4 TIMES DAILY AS DIRECTED 100 each 0   No  current facility-administered medications for this visit.     Review of Systems Review of Systems  Constitutional: Negative.   Respiratory: Negative.   Cardiovascular: Negative.     Blood pressure 128/82, pulse 76, resp. rate 11, height 5' 8.5" (1.74 m), weight 175 lb (79.4 kg).  Physical Exam Physical Exam  Constitutional: He is oriented to person, place, and time. He appears well-developed and well-nourished.  HENT:  Mouth/Throat: Oropharynx is clear and moist.  Eyes: Conjunctivae are normal. No scleral icterus.  Neck: Neck supple.  Cardiovascular: Normal rate, regular rhythm and normal heart sounds.  Pulmonary/Chest: Effort normal and breath sounds normal.      Lymphadenopathy:    He has no cervical adenopathy.  Neurological: He is alert and oriented to person, place, and time.  Skin: Skin is warm and dry.  7 x 8 cm mass at T5 right of midline  Psychiatric: His behavior is normal.    Data Reviewed Hemoglobin A1c dated November 23, 2016: 6.4. See dated April 2018 notable for a platelet count of 130,000.  Hemoglobin 13.9, white blood cell count 5400.  Assessment    Sebaceous cyst of the right back.  Thrombocytopenia.    Plan    Based on that study increase in  size over the last 5 years elective excision has been recommended.  His platelet count should be adequate to tolerate this without the need for the procedure to be completed in the operating theater.  Discussed office vs outpatient excision Post excision, limit upper body gym exercises for 10-14- days  HPI, Physical Exam, Assessment and Plan have been scribed under the direction and in the presence of Robert Bellow, MD. Karie Fetch, RN  I have completed the exam and reviewed the above documentation for accuracy and completeness.  I agree with the above.  Haematologist has been used and any errors in dictation or transcription are unintentional.  Hervey Ard, M.D., F.A.C.S.  Robert Bellow 03/07/2017, 10:26 AM

## 2017-03-07 NOTE — Patient Instructions (Addendum)
The patient is aware to call back for any questions or concerns. Discussed office vs outpatient excision, call for appointment Post excision, limit upper body gym exercises for 10-14- days

## 2017-03-16 ENCOUNTER — Other Ambulatory Visit: Payer: Self-pay | Admitting: Family Medicine

## 2017-03-19 ENCOUNTER — Telehealth: Payer: Self-pay | Admitting: Family

## 2017-03-19 ENCOUNTER — Other Ambulatory Visit: Payer: Self-pay

## 2017-03-19 DIAGNOSIS — E1122 Type 2 diabetes mellitus with diabetic chronic kidney disease: Secondary | ICD-10-CM

## 2017-03-19 MED ORDER — LANCETS MISC: 11 refills | Status: AC

## 2017-03-19 NOTE — Telephone Encounter (Signed)
Rx for diabetic supplies requested

## 2017-03-19 NOTE — Progress Notes (Unsigned)
Lancets refilled 

## 2017-03-19 NOTE — Telephone Encounter (Signed)
rx sent in 

## 2017-03-19 NOTE — Telephone Encounter (Signed)
Copied from [place] [id number]. Topic: Quick Communication - See Telephone Encounter >> Mar 19, 2017  8:16 AM Hewitt Shorts wrote: CRM for notification. See Telephone encounter for: pt is needing to get his lancets refilled he states that he has not checked his sugar for three days now  Best number  03/19/17.

## 2017-03-21 ENCOUNTER — Ambulatory Visit (INDEPENDENT_AMBULATORY_CARE_PROVIDER_SITE_OTHER): Payer: PPO | Admitting: General Surgery

## 2017-03-21 ENCOUNTER — Encounter: Payer: Self-pay | Admitting: General Surgery

## 2017-03-21 VITALS — BP 134/82 | HR 80 | Resp 12 | Ht 68.5 in | Wt 171.0 lb

## 2017-03-21 DIAGNOSIS — L729 Follicular cyst of the skin and subcutaneous tissue, unspecified: Secondary | ICD-10-CM | POA: Diagnosis not present

## 2017-03-21 DIAGNOSIS — D171 Benign lipomatous neoplasm of skin and subcutaneous tissue of trunk: Secondary | ICD-10-CM | POA: Diagnosis not present

## 2017-03-21 MED ORDER — HYDROCODONE-ACETAMINOPHEN 5-325 MG PO TABS: 1.0000 | ORAL_TABLET | ORAL | 0 refills | Status: DC | PRN

## 2017-03-21 NOTE — Patient Instructions (Addendum)
May shower May remove dressing in 2-3 days Steri strips will gradually come off over 2-3 weeks May use an Ice pack as needed for comfort Avoid upper body gym exercising for at least 3 days

## 2017-03-21 NOTE — Progress Notes (Signed)
Patient ID: Daniel Carney, male   DOB: [birth year], 76 y.o.   MRN: [id number]  Chief Complaint  Patient presents with  . Procedure    HPI Daniel Carney is a 76 y.o. male.  Here for excision back mass.  HPI  Past Medical History:  Diagnosis Date  . Cancer [facility])    prostate  . Diabetes mellitus without complication ([place])   . Hyperlipidemia   . Hypertension   . Type 2 diabetes mellitus with diabetic chronic kidney disease ([place]) 2007    Past Surgical History:  Procedure Laterality Date  . PROSTATE SURGERY  2004    Family History  Problem Relation Age of Onset  . Diabetes Mother   . Kidney disease Mother   . Hypertension Mother     Social History Social History   Tobacco Use  . Smoking status: Never Smoker  . Smokeless tobacco: Never Used  Substance Use Topics  . Alcohol use: No    Alcohol/week: 0.0 oz  . Drug use: No    No Known Allergies  Current Outpatient Medications  Medication Sig Dispense Refill  . aspirin EC 81 MG tablet Take 81 mg by mouth daily.    Marland Kitchen glipiZIDE (GLUCOTROL) 5 MG tablet TAKE 1 TABLET (5 MG TOTAL) BY MOUTH DAILY BEFORE BREAKFAST. 90 tablet 1  . Lancets MISC Please dispense One Touch Lancets. Check blood glucose once daily. 100 each 11  . lisinopril (PRINIVIL,ZESTRIL) 10 MG tablet Take 1 tablet (10 mg total) by mouth daily. 90 tablet 3  . metFORMIN (GLUCOPHAGE) 500 MG tablet Take 1 tablet (500 mg total) by mouth 2 (two) times daily with a meal. 180 tablet 3  . Omega-3 Fatty Acids (FISH OIL) 1000 MG CAPS Take 1 capsule by mouth daily.    . ONE TOUCH ULTRA TEST test strip USE UP TO 4 TIMES DAILY AS DIRECTED 100 each 3  . HYDROcodone-acetaminophen (NORCO/VICODIN) 5-325 MG tablet Take 1 tablet by mouth every 4 (four) hours as needed for moderate pain. 10 tablet 0   No current facility-administered medications for this visit.     Review of Systems Review of Systems  Constitutional: Negative.   Respiratory: Negative.   Cardiovascular:  Negative.     Blood pressure 134/82, pulse 80, resp. rate 12, height 5' 8.5" (1.74 m), weight 171 lb (77.6 kg).  Physical Exam Physical Exam  Constitutional: He is oriented to person, place, and time. He appears well-developed and well-nourished.  Pulmonary/Chest:      Neurological: He is alert and oriented to person, place, and time.  Skin: Skin is warm and dry.  Psychiatric: His behavior is normal.    Data Reviewed Plans for excision were reviewed. The area was cleansed with ChloraPrep. 30 mL of 0.5% Xylocaine with 0.25% Marcaine with 1-200,000 epinephrine was utilized. An additional 5 mL of the same mixture was used during the procedure.  The area was re-cleansed with ChloraPrep and draped. A transverse incision was made over the mass. The skin and subcutaneous tissue was divided with hemostasis achieved by 3-0 Vicryl suture ligatures. The lipoma was excised. The dissection was carried down to but did not involve the fascia. The mass measured 6 cm in diameter.he deep tissue was approximated with interrupted 3-0 Vicryl figure-of-eight sutures. The skin was closed with a running 3-0 Vicryl subcuticular suture. Benzoin, Steri-Strips followed by Telfa and Tegaderm dressing. Ice pack and direct pressure for 5 minutes prior to discharge from the office.  Wound care reviewed.  Assessment  Lipoma of the right back.    Plan    Patient did well with excision. Tylenol/Advil/Aleve if needed for pain.Norco provided if needed.    Norco 1 q4h prn pain #10 Avoid upper body gym exercising for at least 3 days Follow up with nurse visit in one week  HPI, Physical Exam, Assessment and Plan have been scribed under the direction and in the presence of Robert Bellow, MD. Karie Fetch, RN  I have completed the exam and reviewed the above documentation for accuracy and completeness.  I agree with the above.  Haematologist has been used and any errors in dictation or transcription are  unintentional.  Hervey Ard, M.D., F.A.C.S.  Robert Bellow 03/21/2017, 9:05 PM

## 2017-03-22 ENCOUNTER — Ambulatory Visit (INDEPENDENT_AMBULATORY_CARE_PROVIDER_SITE_OTHER): Payer: PPO | Admitting: *Deleted

## 2017-03-22 DIAGNOSIS — D171 Benign lipomatous neoplasm of skin and subcutaneous tissue of trunk: Secondary | ICD-10-CM

## 2017-03-22 NOTE — Progress Notes (Signed)
Patient ID: Daniel Carney, male   DOB: [birth year], 76 y.o.   MRN: [id number]  Patient came in today for a wound check/dressing change.  The bandages were saturated. No active bleeding noticed when changed. Reapplied new gauze. The wound is clean, with no signs of infection noted. Follow up as scheduled with nurse next week.

## 2017-03-27 ENCOUNTER — Telehealth: Payer: Self-pay | Admitting: *Deleted

## 2017-03-27 NOTE — Telephone Encounter (Signed)
Left message for patient to call back to results.

## 2017-03-27 NOTE — Telephone Encounter (Signed)
-----   Message from Robert Bellow, MD sent at 03/27/2017  9:08 AM EST ----- Please notify the patient that the tissue removed was just fat. No cancer. Thanks.  ----- Message ----- From: Interface, Lab In [facility] Sent: 03/23/2017   1:58 PM To: Robert Bellow, MD

## 2017-03-28 NOTE — Telephone Encounter (Signed)
The patient called back and was given his pathology results.

## 2017-03-29 ENCOUNTER — Ambulatory Visit (INDEPENDENT_AMBULATORY_CARE_PROVIDER_SITE_OTHER): Payer: PPO | Admitting: *Deleted

## 2017-03-29 DIAGNOSIS — D171 Benign lipomatous neoplasm of skin and subcutaneous tissue of trunk: Secondary | ICD-10-CM

## 2017-03-29 NOTE — Progress Notes (Signed)
Patient ID: Daniel Carney, male   DOB: 1940/10/04, 76 y.o.   MRN: [id number]  Patient came in today for a wound check.  No further bleeding. The wound is clean, with no signs of infection noted. Follow up as scheduled. Aware of pathology.

## 2017-03-29 NOTE — Patient Instructions (Signed)
The patient is aware to call back for any questions or concerns.  

## 2017-05-01 ENCOUNTER — Encounter: Payer: Self-pay | Admitting: Internal Medicine

## 2017-05-01 ENCOUNTER — Ambulatory Visit (INDEPENDENT_AMBULATORY_CARE_PROVIDER_SITE_OTHER): Payer: PPO | Admitting: Internal Medicine

## 2017-05-01 VITALS — BP 148/82 | HR 80 | Temp 98.2°F | Resp 16 | Ht 68.5 in | Wt 172.0 lb

## 2017-05-01 DIAGNOSIS — Z1159 Encounter for screening for other viral diseases: Secondary | ICD-10-CM | POA: Diagnosis not present

## 2017-05-01 DIAGNOSIS — I10 Essential (primary) hypertension: Secondary | ICD-10-CM | POA: Diagnosis not present

## 2017-05-01 DIAGNOSIS — D696 Thrombocytopenia, unspecified: Secondary | ICD-10-CM

## 2017-05-01 DIAGNOSIS — Z1329 Encounter for screening for other suspected endocrine disorder: Secondary | ICD-10-CM

## 2017-05-01 DIAGNOSIS — E119 Type 2 diabetes mellitus without complications: Secondary | ICD-10-CM

## 2017-05-01 DIAGNOSIS — H9202 Otalgia, left ear: Secondary | ICD-10-CM

## 2017-05-01 DIAGNOSIS — Z8546 Personal history of malignant neoplasm of prostate: Secondary | ICD-10-CM

## 2017-05-01 DIAGNOSIS — E1122 Type 2 diabetes mellitus with diabetic chronic kidney disease: Secondary | ICD-10-CM | POA: Diagnosis not present

## 2017-05-01 NOTE — Patient Instructions (Addendum)
Try Ibuprofen/Advil 200 mg up to 2x per day for pain if not better please let me know at f/u  Try heat as well behind your left ear this maybe musculoskeletal pain if it does not go away we will need to work it up  Think about prevnar and pneumonia 23 vaccines to prevent pneumonia  Scheduled fasting labs in 1 week and follow up with me in 2 weeks  Take care   Pneumococcal Conjugate Vaccine (PCV13) What You Need to Know 1. Why get vaccinated? Vaccination can protect both children and adults from pneumococcal disease. Pneumococcal disease is caused by bacteria that can spread from person to person through close contact. It can cause ear infections, and it can also lead to more serious infections of the:  Lungs (pneumonia),  Blood (bacteremia), and  Covering of the brain and spinal cord (meningitis).  Pneumococcal pneumonia is most common among adults. Pneumococcal meningitis can cause deafness and brain damage, and it kills about 1 child in 10 who get it. Anyone can get pneumococcal disease, but children under 74 years of age and adults 44 years and older, people with certain medical conditions, and cigarette smokers are at the highest risk. Before there was a vaccine, the [place] saw:  more than 700 cases of meningitis,  about 13,000 blood infections,  about 5 million ear infections, and  about 200 deaths  in children under 5 each year from pneumococcal disease. Since vaccine became available, severe pneumococcal disease in these children has fallen by 88%. About 18,000 older adults die of pneumococcal disease each year in the [place]. Treatment of pneumococcal infections with penicillin and other drugs is not as effective as it used to be, because some strains of the disease have become resistant to these drugs. This makes prevention of the disease, through vaccination, even more important. 2. PCV13 vaccine Pneumococcal conjugate vaccine (called PCV13) protects against 13  types of pneumococcal bacteria. PCV13 is routinely given to children at 2, 4, 6, and 85-40 months of age. It is also recommended for children and adults 34 to 36 years of age with certain health conditions, and for all adults 61 years of age and older. Your doctor can give you details. 3. Some people should not get this vaccine Anyone who has ever had a life-threatening allergic reaction to a dose of this vaccine, to an earlier pneumococcal vaccine called PCV7, or to any vaccine containing diphtheria toxoid (for example, DTaP), should not get PCV13. Anyone with a severe allergy to any component of PCV13 should not get the vaccine. Tell your doctor if the person being vaccinated has any severe allergies. If the person scheduled for vaccination is not feeling well, your healthcare provider might decide to reschedule the shot on another day. 4. Risks of a vaccine reaction With any medicine, including vaccines, there is a chance of reactions. These are usually mild and go away on their own, but serious reactions are also possible. Problems reported following PCV13 varied by age and dose in the series. The most common problems reported among children were:  About half became drowsy after the shot, had a temporary loss of appetite, or had redness or tenderness where the shot was given.  About 1 out of 3 had swelling where the shot was given.  About 1 out of 3 had a mild fever, and about 1 in 20 had a fever over 102.81F.  Up to about 8 out of 10 became fussy or irritable.  Adults have  reported pain, redness, and swelling where the shot was given; also mild fever, fatigue, headache, chills, or muscle pain. Young children who get PCV13 along with inactivated flu vaccine at the same time may be at increased risk for seizures caused by fever. Ask your doctor for more information. Problems that could happen after any vaccine:  People sometimes faint after a medical procedure, including vaccination. Sitting  or lying down for about 15 minutes can help prevent fainting, and injuries caused by a fall. Tell your doctor if you feel dizzy, or have vision changes or ringing in the ears.  Some older children and adults get severe pain in the shoulder and have difficulty moving the arm where a shot was given. This happens very rarely.  Any medication can cause a severe allergic reaction. Such reactions from a vaccine are very rare, estimated at about 1 in a million doses, and would happen within a few minutes to a few hours after the vaccination. As with any medicine, there is a very small chance of a vaccine causing a serious injury or death. The safety of vaccines is always being monitored. For more information, visit: http://www.aguilar.org/ 5. What if there is a serious reaction? What should I look for? Look for anything that concerns you, such as signs of a severe allergic reaction, very high fever, or unusual behavior. Signs of a severe allergic reaction can include hives, swelling of the face and throat, difficulty breathing, a fast heartbeat, dizziness, and weakness-usually within a few minutes to a few hours after the vaccination. What should I do?  If you think it is a severe allergic reaction or other emergency that can't wait, call 9-1-1 or get the person to the nearest hospital. Otherwise, call your doctor.  Reactions should be reported to the Vaccine Adverse Event Reporting System (VAERS). Your doctor should file this report, or you can do it yourself through the VAERS web site at www.vaers.SamedayNews.es, or by calling 781-665-3824. ? VAERS does not give medical advice. 6. The National Vaccine Injury Compensation Program The [employer] Vaccine Injury Compensation Program (VICP) is a federal program that was created to compensate people who may have been injured by certain vaccines. Persons who believe they may have been injured by a vaccine can learn about the program and about filing a claim by  calling [phone] or visiting the [place] website at GoldCloset.com.ee. There is a time limit to file a claim for compensation. 7. How can I learn more?  Ask your healthcare provider. He or she can give you the vaccine package insert or suggest other sources of information.  Call your local or state health department.  Contact the Centers for Disease Control and Prevention (CDC): ? Call 541-235-8408 (1-800-CDC-INFO) or ? Visit CDC's website at http://hunter.com/ Vaccine Information Statement, PCV13 Vaccine (02/26/2014) This information is not intended to replace advice given to you by your health care provider. Make sure you discuss any questions you have with your health care provider. Document Released: 02/05/2006 Document Revised: 12/30/2015 Document Reviewed: 12/30/2015 Elsevier Interactive Patient Education  2017 [employer] Inc.    Cervical Sprain A cervical sprain is a stretch or tear in the tissues that connect bones (ligaments) in the neck. Most neck (cervical) sprains get better in 4-6 weeks. Follow these instructions at home: If you have a neck collar:  Wear it as told by your doctor. Do not take off (do not remove) the collar unless your doctor says that this is safe.  Ask your doctor before adjusting  your collar.  If you have long hair, keep it outside of the collar.  Ask your doctor if you may take off the collar for cleaning and bathing. If you may take off the collar: ? Follow instructions from your doctor about how to take off the collar safely. ? Clean the collar by wiping it with mild soap and water. Let it air-dry all the way. ? If your collar has removable pads:  Take the pads out every 1-2 days.  Hand wash the pads with soap and water.  Let the pads air-dry all the way before you put them back in the collar. Do not dry them in a clothes dryer. Do not dry them with a hair dryer. ? Check your skin under the collar for irritation or sores.  If you see any, tell your doctor. Managing pain, stiffness, and swelling  Use a cervical traction device, if told by your doctor.  If told, put heat on the affected area. Do this before exercises (physical therapy) or as often as told by your doctor. Use the heat source that your doctor recommends, such as a moist heat pack or a heating pad. ? Place a towel between your skin and the heat source. ? Leave the heat on for 20-30 minutes. ? Take the heat off (remove the heat) if your skin turns bright red. This is very important if you cannot feel pain, heat, or cold. You may have a greater risk of getting burned.  Put ice on the affected area. ? Put ice in a plastic bag. ? Place a towel between your skin and the bag. ? Leave the ice on for 20 minutes, 2-3 times a day. Activity  Do not drive while wearing a neck collar. If you do not have a neck collar, ask your doctor if it is safe to drive.  Do not drive or use heavy machinery while taking prescription pain medicine or muscle relaxants, unless your doctor approves.  Do not lift anything that is heavier than 10 lb (4.5 kg) until your doctor tells you that it is safe.  Rest as told by your doctor.  Avoid activities that make you feel worse. Ask your doctor what activities are safe for you.  Do exercises as told by your doctor or physical therapist. Preventing neck sprain  Practice good posture. Adjust your workstation to help with this, if needed.  Exercise regularly as told by your doctor or physical therapist.  Avoid activities that are risky or may cause a neck sprain (cervical sprain). General instructions  Take over-the-counter and prescription medicines only as told by your doctor.  Do not use any products that contain nicotine or tobacco. This includes cigarettes and e-cigarettes. If you need help quitting, ask your doctor.  Keep all follow-up visits as told by your doctor. This is important. Contact a doctor if:  You  have pain or other symptoms that get worse.  You have symptoms that do not get better after 2 weeks.  You have pain that does not get better with medicine.  You start to have new, unexplained symptoms.  You have sores or irritated skin from wearing your neck collar. Get help right away if:  You have very bad pain.  You have any of the following in any part of your body: ? Loss of feeling (numbness). ? Tingling. ? Weakness.  You cannot move a part of your body (you have paralysis).  Your activity level does not improve. Summary  A cervical  sprain is a stretch or tear in the tissues that connect bones (ligaments) in the neck.  If you have a neck (cervical) collar, do not take off the collar unless your doctor says that this is safe.  Put ice on affected areas as told by your doctor.  Put heat on affected areas as told by your doctor.  Good posture and regular exercise can help prevent a neck sprain from happening again. This information is not intended to replace advice given to you by your health care provider. Make sure you discuss any questions you have with your health care provider. Document Released: 09/27/2007 Document Revised: 12/21/2015 Document Reviewed: 12/21/2015 Elsevier Interactive Patient Education  2017 [employer].

## 2017-05-02 ENCOUNTER — Encounter: Payer: Self-pay | Admitting: Internal Medicine

## 2017-05-02 DIAGNOSIS — D696 Thrombocytopenia, unspecified: Secondary | ICD-10-CM | POA: Insufficient documentation

## 2017-05-02 DIAGNOSIS — Z8546 Personal history of malignant neoplasm of prostate: Secondary | ICD-10-CM | POA: Insufficient documentation

## 2017-05-02 NOTE — Progress Notes (Signed)
Chief Complaint  Patient presents with  . Headache   Acute visit  1. C/o pain above and behind left ear pain started yesterday and was 7/10 but tool Advil OTC x 1 pill and pain improved to 4/10. Pain is intermittent. He is not sure if he may have slept the wrong way. Denies neck pain. Pain is sharp. He denies trauma or falls 2. H/o prostate cancer s/p removal needs PSA checked  3. HTN BP slightly elevated today he normally has normal readings per pt    Review of Systems  Constitutional: Negative for weight loss.  HENT: Negative for ear pain and hearing loss.   Eyes:       No vision problems  Respiratory: Negative for shortness of breath.   Cardiovascular: Negative for chest pain.  Genitourinary:       H/o prostate cancer  Musculoskeletal: Negative for falls.  Skin: Negative for rash.  Neurological: Positive for headaches.  Psychiatric/Behavioral: Negative for memory loss.   Past Medical History:  Diagnosis Date  . Cancer [facility])    prostate s/p removal 2004   . Diabetes mellitus without complication ([place])   . Hyperlipidemia   . Hypertension   . 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 PROSTATE SURGERY  2004   Family History  Problem Relation Age of Onset  . Diabetes Mother   . Kidney disease Mother   . Hypertension Mother    Social History   Socioeconomic History  . Marital status: Married    Spouse name: Not on file  . Number of children: Not on file  . Years of education: Not on file  . Highest education level: Not on file  Social Needs  . Financial resource strain: Not on file  . Food insecurity - worry: Not on file  . Food insecurity - inability: Not on file  . Transportation needs - medical: Not on file  . Transportation needs - non-medical: Not on file  Occupational History  . Not on file  Tobacco Use  . Smoking status: Never Smoker  . Smokeless tobacco: Never Used    Substance and Sexual Activity  . Alcohol use: No    Alcohol/week: 0.0 oz  . Drug use: No  . Sexual activity: Not Currently    Partners: Female  Other Topics Concern  . Not on file  Social History Narrative   From [place]    Current Meds  Medication Sig  . aspirin EC 81 MG tablet Take 81 mg by mouth daily.  Marland Kitchen glipiZIDE (GLUCOTROL) 5 MG tablet TAKE 1 TABLET (5 MG TOTAL) BY MOUTH DAILY BEFORE BREAKFAST.  Marland Kitchen Lancets MISC Please dispense One Touch Lancets. Check blood glucose once daily.  Marland Kitchen lisinopril (PRINIVIL,ZESTRIL) 10 MG tablet Take 1 tablet (10 mg total) by mouth daily.  . metFORMIN (GLUCOPHAGE) 500 MG tablet Take 1 tablet (500 mg total) by mouth 2 (two) times daily with a meal.  . Omega-3 Fatty Acids (FISH OIL) 1000 MG CAPS Take 1 capsule by mouth daily.  . ONE TOUCH ULTRA TEST test strip USE UP TO 4 TIMES DAILY AS DIRECTED   No Known Allergies No results found for this or any previous visit (from the past 2160 hour(s)). Objective  Body mass index is 25.77 kg/m. Wt Readings from Last 3 Encounters:  05/01/17 172 lb (78 kg)  03/21/17 171 lb (77.6 kg)  03/07/17 175 lb (79.4 kg)  Temp Readings from Last 3 Encounters:  05/01/17 98.2 F (36.8 C) (Oral)  02/07/17 98.2 F (36.8 C) (Oral)  11/23/16 98.2 F (36.8 C) (Oral)   BP Readings from Last 3 Encounters:  05/01/17 (!) 148/82  03/21/17 134/82  03/07/17 128/82   Pulse Readings from Last 3 Encounters:  05/01/17 80  03/21/17 80  03/07/17 76  O2 sat room air 98%   Physical Exam  Constitutional: He is oriented to person, place, and time and well-developed, well-nourished, and in no distress.  HENT:  Head: Normocephalic and atraumatic.    Right Ear: Hearing, tympanic membrane, external ear and ear canal normal. No drainage.  Left Ear: Hearing, tympanic membrane, external ear and ear canal normal. No drainage.  Mouth/Throat: Oropharynx is clear and moist and mucous membranes are normal.  Eyes: Conjunctivae are  normal. Pupils are equal, round, and reactive to light.  Cardiovascular: Normal rate, regular rhythm and normal heart sounds.  Pulmonary/Chest: Effort normal and breath sounds normal.  Neurological: He is alert and oriented to person, place, and time. Gait normal. Gait normal.  Skin: Skin is warm, dry and intact.  Psychiatric: Mood, memory, affect and judgment normal.  Nursing note and vitals reviewed.   Assessment   1. Left superior postauricular pain ddx mastoiditis, no evidence of inner ear infection on exam, occipital neuralgia vs MSK vs other  2. H/o prostate cancer  3. HTN sl elevated today  4. HM  5. Thrombocytopenia ? Etiology  6. DM 2  Plan  1. Advil OTC 1-2 x per day prn x 1-2 weeks prn  RTC if does not resolve and if doesn't resolve consider imaging  Try heat as well  2. Need to check PSA pt has not had urology f/u since 2004 when he had surgery  Consider referral urology in future  3. Cont meds recheck BP at f/u  4. Declines flu shot  Had Tdap Consider pna 23, prevnar, shingrix in future pt to think about it Labs next week CMET, Cbc, lipid, UA, protein, A1C, TSH, T4, PSA s/p prostate removal 2004, Hep B/C/HIV  5. W/u hep B/C, HIV, PSA, repeat CBC 6. Cont meds see labs as above Ask about eye exam  Do foot exam at f/u  Check urine in 1 week.   Fasting labs next week Provider: Dr. Olivia Mackie McLean-Scocuzza-Internal Medicine

## 2017-05-11 ENCOUNTER — Ambulatory Visit (INDEPENDENT_AMBULATORY_CARE_PROVIDER_SITE_OTHER): Payer: PPO

## 2017-05-11 ENCOUNTER — Other Ambulatory Visit (INDEPENDENT_AMBULATORY_CARE_PROVIDER_SITE_OTHER): Payer: PPO

## 2017-05-11 VITALS — BP 128/68 | HR 65 | Temp 98.0°F | Resp 14 | Ht 67.0 in | Wt 169.0 lb

## 2017-05-11 DIAGNOSIS — I10 Essential (primary) hypertension: Secondary | ICD-10-CM | POA: Diagnosis not present

## 2017-05-11 DIAGNOSIS — Z0000 Encounter for general adult medical examination without abnormal findings: Secondary | ICD-10-CM | POA: Diagnosis not present

## 2017-05-11 DIAGNOSIS — E119 Type 2 diabetes mellitus without complications: Secondary | ICD-10-CM

## 2017-05-11 DIAGNOSIS — Z1159 Encounter for screening for other viral diseases: Secondary | ICD-10-CM | POA: Diagnosis not present

## 2017-05-11 DIAGNOSIS — Z1331 Encounter for screening for depression: Secondary | ICD-10-CM

## 2017-05-11 DIAGNOSIS — D696 Thrombocytopenia, unspecified: Secondary | ICD-10-CM | POA: Diagnosis not present

## 2017-05-11 DIAGNOSIS — Z8546 Personal history of malignant neoplasm of prostate: Secondary | ICD-10-CM | POA: Diagnosis not present

## 2017-05-11 DIAGNOSIS — Z1329 Encounter for screening for other suspected endocrine disorder: Secondary | ICD-10-CM | POA: Diagnosis not present

## 2017-05-11 LAB — CBC WITH DIFFERENTIAL/PLATELET
BASOS PCT: 0.3 % (ref 0.0–3.0)
Basophils Absolute: 0 10*3/uL (ref 0.0–0.1)
EOS PCT: 3.3 % (ref 0.0–5.0)
Eosinophils Absolute: 0.2 10*3/uL (ref 0.0–0.7)
HEMATOCRIT: 43.1 % (ref 39.0–52.0)
HEMOGLOBIN: 13.7 g/dL (ref 13.0–17.0)
LYMPHS PCT: 39.4 % (ref 12.0–46.0)
Lymphs Abs: 1.8 10*3/uL (ref 0.7–4.0)
MCHC: 31.9 g/dL (ref 30.0–36.0)
MCV: 84.5 fl (ref 78.0–100.0)
Monocytes Absolute: 0.5 10*3/uL (ref 0.1–1.0)
Monocytes Relative: 11.5 % (ref 3.0–12.0)
Neutro Abs: 2.1 10*3/uL (ref 1.4–7.7)
Neutrophils Relative %: 45.5 % (ref 43.0–77.0)
Platelets: 136 10*3/uL — ABNORMAL LOW (ref 150.0–400.0)
RBC: 5.1 Mil/uL (ref 4.22–5.81)
RDW: 13.7 % (ref 11.5–15.5)
WBC: 4.6 10*3/uL (ref 4.0–10.5)

## 2017-05-11 LAB — URINALYSIS, ROUTINE W REFLEX MICROSCOPIC
Bilirubin Urine: NEGATIVE
Hgb urine dipstick: NEGATIVE
Ketones, ur: NEGATIVE
Leukocytes, UA: NEGATIVE
NITRITE: NEGATIVE
RBC / HPF: NONE SEEN (ref 0–?)
SPECIFIC GRAVITY, URINE: 1.02 (ref 1.000–1.030)
Total Protein, Urine: NEGATIVE
URINE GLUCOSE: NEGATIVE
Urobilinogen, UA: 0.2 (ref 0.0–1.0)
pH: 5.5 (ref 5.0–8.0)

## 2017-05-11 LAB — COMPREHENSIVE METABOLIC PANEL
ALBUMIN: 4.2 g/dL (ref 3.5–5.2)
ALK PHOS: 78 U/L (ref 39–117)
ALT: 11 U/L (ref 0–53)
AST: 18 U/L (ref 0–37)
BILIRUBIN TOTAL: 0.5 mg/dL (ref 0.2–1.2)
BUN: 19 mg/dL (ref 6–23)
CO2: 31 mEq/L (ref 19–32)
CREATININE: 1.19 mg/dL (ref 0.40–1.50)
Calcium: 9.7 mg/dL (ref 8.4–10.5)
Chloride: 102 mEq/L (ref 96–112)
GFR: 76.18 mL/min (ref >60.00)
Glucose, Bld: 116 mg/dL — ABNORMAL HIGH (ref 70–99)
Potassium: 4.4 mEq/L (ref 3.5–5.1)
SODIUM: 140 meq/L (ref 135–145)
TOTAL PROTEIN: 7.7 g/dL (ref 6.0–8.3)

## 2017-05-11 LAB — LIPID PANEL
CHOLESTEROL: 175 mg/dL (ref 0–200)
HDL: 39.1 mg/dL (ref >39.00)
LDL Cholesterol: 114 mg/dL — ABNORMAL HIGH (ref 0–99)
NonHDL: 136.35
Total CHOL/HDL Ratio: 4
Triglycerides: 112 mg/dL (ref 0.0–149.0)
VLDL: 22.4 mg/dL (ref 0.0–40.0)

## 2017-05-11 LAB — TSH: TSH: 1.15 u[IU]/mL (ref 0.35–4.50)

## 2017-05-11 LAB — PSA: PSA: 0 ng/mL — ABNORMAL LOW (ref 0.10–4.00)

## 2017-05-11 LAB — MICROALBUMIN / CREATININE URINE RATIO
Creatinine,U: 131.5 mg/dL
MICROALB/CREAT RATIO: 6.7 mg/g (ref 0.0–30.0)
Microalb, Ur: 8.8 mg/dL — ABNORMAL HIGH (ref 0.0–1.9)

## 2017-05-11 LAB — HEMOGLOBIN A1C: HEMOGLOBIN A1C: 6.8 % — AB (ref 4.6–6.5)

## 2017-05-11 LAB — T4, FREE: Free T4: 0.89 ng/dL (ref 0.60–1.60)

## 2017-05-11 NOTE — Patient Instructions (Addendum)
  Mr. Daniel Carney , Thank you for taking time to come for your Medicare Wellness Visit. I appreciate your ongoing commitment to your health goals. Please review the following plan we discussed and let me know if I can assist you in the future.   Follow up with Dr. McLean-Scocuzzo as needed.    Bring a copy of your [place] and/or Living Will to be scanned into chart once completed.  Have a great day!  These are the goals we discussed: Goals    . Healthy Lifestyle     Stay hydrated Low carb foods Exercise       This is a list of the screening recommended for you and due dates:  Health Maintenance  Topic Date Due  . Pneumonia vaccines (2 of 2 - PPSV23) 04/24/2014  . Eye exam for diabetics  06/14/2016  . Complete foot exam   02/10/2017  . Flu Shot  02/07/2018*  . Hemoglobin A1C  05/26/2017  . Tetanus Vaccine  07/12/2025  *Topic was postponed. The date shown is not the original due date.

## 2017-05-11 NOTE — Progress Notes (Signed)
Subjective:   Daniel Carney is a 77 y.o. male who presents for Medicare Annual (Subsequent) preventive examination.  Review of Systems:  No ROS.  Medicare Wellness Visit. Additional risk factors are reflected in the social history.  Cardiac Risk Factors include: advanced age (>60men, >61 women);male gender;hypertension     Objective:     Vitals: BP 128/68 (BP Location: Left Arm, Patient Position: Sitting, Cuff Size: Normal)   Pulse 65   Temp 98 F (36.7 C) (Oral)   Resp 14   Ht 5\' 7"  (1.702 m)   Wt 169 lb (76.7 kg)   SpO2 98%   BMI 26.47 kg/m   Body mass index is 26.47 kg/m.  Advanced Directives 05/11/2017 07/13/2015 12/22/2014  Does Patient Have a Medical Advance Directive? No No No  Would patient like information on creating a medical advance directive? Yes (MAU/Ambulatory/Procedural Areas - Information given) No - patient declined information No - patient declined information    Tobacco Social History   Tobacco Use  Smoking Status Never Smoker  Smokeless Tobacco Never Used     Counseling given: Not Answered   Clinical Intake:  Pre-visit preparation completed: Yes  Pain : No/denies pain     Nutritional Status: BMI 25 -29 Overweight Diabetes: Yes  How often do you need to have someone help you when you read instructions, pamphlets, or other written materials from your doctor or pharmacy?: 1 - Never  Interpreter Needed?: No     Past Medical History:  Diagnosis Date  . Cancer [facility])    prostate s/p removal 2004   . Diabetes mellitus without complication ([place])   . Hyperlipidemia   . Hypertension   . Type 2 diabetes mellitus with diabetic chronic kidney disease ([place]) 2007   Past Surgical History:  Procedure Laterality Date  . PROSTATE SURGERY  2004   Family History  Problem Relation Age of Onset  . Diabetes Mother   . Kidney disease Mother   . Hypertension Mother    Social History   Socioeconomic History  . Marital status: Married   Spouse name: None  . Number of children: None  . Years of education: None  . Highest education level: None  Social Needs  . Financial resource strain: None  . Food insecurity - worry: None  . Food insecurity - inability: None  . Transportation needs - medical: None  . Transportation needs - non-medical: None  Occupational History  . None  Tobacco Use  . Smoking status: Never Smoker  . Smokeless tobacco: Never Used  Substance and Sexual Activity  . Alcohol use: No    Alcohol/week: 0.0 oz  . Drug use: No  . Sexual activity: Not Currently    Partners: Female  Other Topics Concern  . None  Social History Narrative   From [place]     Outpatient Encounter Medications as of 05/11/2017  Medication Sig  . aspirin EC 81 MG tablet Take 81 mg by mouth daily.  Marland Kitchen glipiZIDE (GLUCOTROL) 5 MG tablet TAKE 1 TABLET (5 MG TOTAL) BY MOUTH DAILY BEFORE BREAKFAST.  Marland Kitchen Lancets MISC Please dispense One Touch Lancets. Check blood glucose once daily.  Marland Kitchen lisinopril (PRINIVIL,ZESTRIL) 10 MG tablet Take 1 tablet (10 mg total) by mouth daily.  . metFORMIN (GLUCOPHAGE) 500 MG tablet Take 1 tablet (500 mg total) by mouth 2 (two) times daily with a meal.  . Omega-3 Fatty Acids (FISH OIL) 1000 MG CAPS Take 1 capsule by mouth daily.  . ONE TOUCH ULTRA TEST  test strip USE UP TO 4 TIMES DAILY AS DIRECTED   No facility-administered encounter medications on file as of 05/11/2017.     Activities of Daily Living In your present state of health, do you have any difficulty performing the following activities: 05/11/2017  Hearing? N  Vision? N  Difficulty concentrating or making decisions? N  Walking or climbing stairs? N  Dressing or bathing? N  Doing errands, shopping? N  Preparing Food and eating ? N  Using the Toilet? N  In the past six months, have you accidently leaked urine? N  Do you have problems with loss of bowel control? N  Managing your Medications? N  Managing your Finances? N  Housekeeping or  managing your Housekeeping? N  Some recent data might be hidden    Patient Care Team: McLean-Scocuzza, Nino Glow, MD as PCP - General (Internal Medicine) Vidal Schwalbe, Yvetta Coder, FNP as Nurse Practitioner (Family Medicine) Bary Castilla Forest Gleason, MD (General Surgery)    Assessment:   This is a routine wellness examination for Daniel Carney. The goal of the wellness visit is to assist the patient how to close the gaps in care and create a preventative care plan for the patient.   The roster of all physicians providing medical care to patient is listed in the Snapshot section of the chart.  Osteoporosis risk reviewed.    Safety issues reviewed; Smoke and carbon monoxide detectors in the home. No firearms in the home. Wears seatbelts when driving or riding with others. Patient does wear sunscreen or protective clothing when in direct sunlight. No violence in the home.  Depression- PHQ 2 &9 complete.  No signs/symptoms or verbal communication regarding little pleasure in doing things, feeling down, depressed or hopeless. No changes in sleeping, energy, eating, concentrating.  No thoughts of self harm or harm towards others.  Time spent on this topic is 8 minutes.   Patient is alert, normal appearance, oriented to person/place/and time. Correctly identified the president of the [place], recall of 2/3 words, and performing simple calculations. Displays appropriate judgement and can read correct time from watch face.   No new identified risk were noted.  No failures at ADL's or IADL's.   BMI- discussed the importance of a healthy diet, water intake and the benefits of aerobic exercise. Educational material provided. He has a healthy diet and exercise regimen of strength training/weights, walking and calisthenics 5 days weekly for more than 60 min.  24 hour diet recall: Diabetic diet, portion controlled.  Daily fluid intake: 0 cups of caffeine, 8 cups of water.  Dental- every 6 months.  Eye- Visual acuity  not assessed per patient preference since they have regular follow up with the ophthalmologist.  Wears corrective lenses.  Sleep patterns- Sleeps 5-6 hours at night.  Wakes feeling rested.  TDAP vaccine deferred per patient preference.  Follow up with insurance.  Educational material provided.  HTN; followed by PCP.  Taking medication as prescribed.  Improved from last.    Diabetes; followed by PCP.  He checks BS once daily.  Taking all medications as prescribed.    Pneumococcal discussed; he cannot recall which vaccine has been administered.  [place] of [place].  Deferred for follow up with PCP.  Patient Concerns: Requests referral placed to podiatrist for diabetic foot exam; routine maintenance.  No open wounds/cuts, bruising or loss of feeling.    Exercise Activities and Dietary recommendations Current Exercise Habits: Home exercise routine, Type of exercise: strength training/weights;treadmill;walking;calisthenics, Time (Minutes): > 60,  Frequency (Times/Week): 5, Weekly Exercise (Minutes/Week): 0, Intensity: Intense  Goals    . Healthy Lifestyle     Stay hydrated Low carb foods Exercise       Fall Risk Fall Risk  05/11/2017 02/11/2016 07/21/2015 04/29/2015 10/23/2014  Falls in the past year? No No Yes No No  Number falls in past yr: - - 1 - -  Injury with Fall? - - Yes - -  Comment - - L buttock laceration - -  Follow up - - Falls prevention discussed - -   Depression Screen PHQ 2/9 Scores 05/11/2017 02/11/2016 07/21/2015 04/29/2015  PHQ - 2 Score 0 0 0 0  PHQ- 9 Score 0 - - -     Cognitive Function MMSE - Mini Mental State Exam 05/11/2017  Orientation to time 5  Orientation to Place 5  Registration 3  Attention/ Calculation 5  Recall 2  Language- name 2 objects 2  Language- repeat 1  Language- follow 3 step command 3  Language- read & follow direction 1  Write a sentence 1  Copy design 1  Total score 29        Immunization History  Administered  Date(s) Administered  . Influenza, High Dose Seasonal PF 02/11/2016  . Tdap 07/13/2015   Screening Tests Health Maintenance  Topic Date Due  . PNA vac Low Risk Adult (2 of 2 - PPSV23) 04/24/2014  . OPHTHALMOLOGY EXAM  06/14/2016  . FOOT EXAM  02/10/2017  . INFLUENZA VACCINE  02/07/2018 (Originally 11/22/2016)  . HEMOGLOBIN A1C  05/26/2017  . TETANUS/TDAP  07/12/2025       Plan:   End of life planning; Advanced aging; Advanced directives discussed.  No HCPOA/Living Will.  Additional information provided to help them start the conversation with family.  Copy of HCPOA/Living Will requested upon completion. Time spent on this topic is 20 minutes.  I have personally reviewed and noted the following in the patient's chart:   . Medical and social history . Use of alcohol, tobacco or illicit drugs  . Current medications and supplements . Functional ability and status . Nutritional status . Physical activity . Advanced directives . List of other physicians . Hospitalizations, surgeries, and ER visits in previous 12 months . Vitals . Screenings to include cognitive, depression, and falls . Referrals and appointments  In addition, I have reviewed and discussed with patient certain preventive protocols, quality metrics, and best practice recommendations. A written personalized care plan for preventive services as well as general preventive health recommendations were provided to patient.     Varney Biles, LPN  1/32/4401

## 2017-05-12 LAB — HEPATITIS C ANTIBODY
Hepatitis C Ab: NONREACTIVE
SIGNAL TO CUT-OFF: 0.02 (ref <1.00)

## 2017-05-12 LAB — HIV ANTIBODY (ROUTINE TESTING W REFLEX): HIV 1&2 Ab, 4th Generation: NONREACTIVE

## 2017-05-12 LAB — HEPATITIS B SURFACE ANTIBODY, QUANTITATIVE: Hepatitis B-Post: 16 m[IU]/mL (ref >=10)

## 2017-05-12 LAB — HEPATITIS B SURFACE ANTIGEN: HEP B S AG: NONREACTIVE

## 2017-05-12 LAB — HEPATITIS B CORE ANTIBODY, TOTAL: HEP B C TOTAL AB: REACTIVE — AB

## 2017-05-17 ENCOUNTER — Other Ambulatory Visit: Payer: PPO

## 2017-05-23 DIAGNOSIS — R011 Cardiac murmur, unspecified: Secondary | ICD-10-CM | POA: Insufficient documentation

## 2017-05-24 ENCOUNTER — Encounter: Payer: Self-pay | Admitting: Internal Medicine

## 2017-05-24 ENCOUNTER — Ambulatory Visit (INDEPENDENT_AMBULATORY_CARE_PROVIDER_SITE_OTHER): Payer: PPO | Admitting: Internal Medicine

## 2017-05-24 VITALS — BP 144/76 | HR 74 | Temp 98.0°F | Ht 67.0 in | Wt 172.4 lb

## 2017-05-24 DIAGNOSIS — D696 Thrombocytopenia, unspecified: Secondary | ICD-10-CM

## 2017-05-24 DIAGNOSIS — R011 Cardiac murmur, unspecified: Secondary | ICD-10-CM | POA: Diagnosis not present

## 2017-05-24 DIAGNOSIS — E785 Hyperlipidemia, unspecified: Secondary | ICD-10-CM

## 2017-05-24 DIAGNOSIS — E1122 Type 2 diabetes mellitus with diabetic chronic kidney disease: Secondary | ICD-10-CM

## 2017-05-24 DIAGNOSIS — I10 Essential (primary) hypertension: Secondary | ICD-10-CM

## 2017-05-24 DIAGNOSIS — E1169 Type 2 diabetes mellitus with other specified complication: Secondary | ICD-10-CM

## 2017-05-24 MED ORDER — LISINOPRIL 20 MG PO TABS: 20.0000 mg | ORAL_TABLET | Freq: Every day | ORAL | 1 refills | Status: DC

## 2017-05-24 NOTE — Progress Notes (Signed)
Pre visit review using our clinic review tool, if applicable. No additional management support is needed unless otherwise documented below in the visit note. 

## 2017-05-24 NOTE — Patient Instructions (Addendum)
We will do echo of your heart at [facility] and ultrasound of your abdomen  We will refer you to Dr. Grayland Ormond for low platelets to try to figure out the cause  F/u in 2-3 months  Increase blood pressure medication to 20 mg lisinopril    Heart Murmur A heart murmur is an extra sound that is caused by chaotic blood flow. The murmur can be heard as a "hum" or "whoosh" sound when blood flows through the heart. The heart has four areas called chambers. Valves separate the upper and lower chambers from each other (tricuspid valve and mitral valve) and separate the lower chambers of the heart from pathways that lead away from the heart (aortic valve and pulmonary valve). Normally, the valves open to let blood flow through or out of your heart, and then they shut to keep the blood from flowing backward. There are two types of heart murmurs:  Innocent murmurs. Most people with this type of heart murmur do not have a heart problem. Many children have innocent heart murmurs. Your health care provider may suggest some basic testing to find out whether your murmur is an innocent murmur. If an innocent heart murmur is found, there is no need for further tests or treatment and no need to restrict activities or stop playing sports.  Abnormal murmurs. These types of murmurs can occur in children and adults. Abnormal murmurs may be a sign of a more serious heart condition, such as a heart defect present at birth (congenital defect) or heart valve disease.  What are the causes? This condition is caused by heart valves that are not working properly. In children, abnormal heart murmurs are typically caused by congenital defects. In adults, abnormal murmurs are usually from heart valve problems caused by disease, infection, or aging. Three types of heart valve defects can cause a murmur:  Regurgitation. This is when blood leaks back through the valve in the wrong direction.  Mitral valve prolapse. This is when the mitral  valve of the heart has a loose flap and does not close tightly.  Stenosis. This is when a valve does not open enough and blocks blood flow.  This condition may also be caused by:  Pregnancy.  Fever.  Overactive thyroid gland.  Anemia.  Exercise.  Rapid growth spurts (in children).  What are the signs or symptoms? Innocent murmurs do not cause symptoms, and many people with abnormal murmurs may or may not have symptoms. If symptoms do develop, they may include:  Shortness of breath.  Blue coloring of the skin, especially on the fingertips.  Chest pain.  Palpitations, or feeling a fluttering or skipped heartbeat.  Fainting.  Persistent cough.  Getting tired much faster than expected.  Swelling in the abdomen, feet, or ankles.  How is this diagnosed? This condition may be diagnosed during a routine physical or other exam. If your health care provider hears a murmur with a stethoscope, he or she will listen for:  Where the murmur is located in your heart.  How long the murmur lasts (duration).  When the murmur is heard during the heartbeat.  How loud the murmur is. This may help the health care provider figure out what is causing the murmur.  You may be referred to a heart specialist (cardiologist). You may also have other tests, including:  Electrocardiogram (ECG or EKG). This test measures the electrical activity of your heart.  Echocardiogram. This test uses high frequency sound waves to make pictures of your heart.  MRI or chest X-ray.  Cardiac catheterization. This test looks at blood flow through the heart.  For children and adults who have an abnormal heart murmur and want to stay active, it is important to complete testing, review test results, and receive recommendations from your health care provider. If heart disease is present, it may not be safe to play or be active. How is this treated? Heart murmurs themselves do not need treatment. In some  cases, a heart murmur may go away on its own. If an underlying problem or disease is causing the murmur, you may need treatment. If treatment is needed, it will depend on the type and severity of the disease or heart problem causing the murmur. Treatment may include:  Medicine.  Surgery.  Dietary and lifestyle changes.  Follow these instructions at home:  Talk with your health care provider before participating in sports or other activities that require a lot of effort and energy (are strenuous).  Learn as much as possible about your condition and any related diseases. Ask your health care provider if you may at risk for any medical emergencies.  Talk with your health care provider about what symptoms you should look out for.  It is up to you to get your test results. Ask your health care provider, or the department that is doing the test, when your results will be ready.  Keep all follow-up visits as told by your health care provider. This is important. Contact a health care provider if:  You feel light-headed.  You are frequently short of breath.  You feel more tired than usual.  You are having a hard time keeping up with normal activities or fitness routines.  You have swelling in your ankles or feet.  You have chest pain.  You notice that your heart often beats irregularly.  You develop any new symptoms. Get help right away if:  You develop severe chest pain.  You are having trouble breathing.  You have fainting spells.  Your symptoms suddenly get worse. These symptoms may represent a serious problem that is an emergency. Do not wait to see if the symptoms will go away. Get medical help right away. Call your local emergency services (911 in the [place] Do not drive yourself to the hospital. Summary  Normally, the heart valves open to let blood flow through or out of your heart, and then they shut to keep the blood from flowing backward.  Heart murmur is caused by  heart valves that are not working properly.  You may need treatment if an underlying problem or disease is causing the heart murmur. Treatment may include medicine, surgery, or dietary and lifestyle changes.  Talk with your health care provider before participating in sports or other activities that require a lot of effort and energy (are strenuous).  Talk with your health care provider about what symptoms you should watch out for. This information is not intended to replace advice given to you by your health care provider. Make sure you discuss any questions you have with your health care provider. Document Released: 05/18/2004 Document Revised: 03/29/2016 Document Reviewed: 03/29/2016 Elsevier Interactive Patient Education  2018 [employer].  Thrombocytopenia Thrombocytopenia is a condition in which you have an abnormally decreased number of platelets in your blood. Platelets are also called thrombocytes. Platelets are needed for blood clotting. Some cases of thrombocytopenia are mild while others are more severe. What are the causes? This condition may be caused by:  Decreased production of platelets.  This can be caused by: ? Aplastic anemia, in which your bone marrow quits making blood cells. ? Cancer in the bone marrow. ? Use of certain medicines, including chemotherapy. ? Infection in the bone marrow. ? Heavy alcohol consumption.  Increased destruction of platelets. This can be caused by: ? Certain immune diseases. ? Use of certain drugs. ? Certain blood clotting disorders. ? Certain inherited disorders. ? Certain bleeding disorders. ? Pregnancy.  Having an enlarged spleen (hypersplenism). In hypersplenism, the spleen gathers up platelets from circulation. This means that the platelets are not available to help with blood clotting. The spleen can be enlarged because of cirrhosis or other conditions.  What are the signs or symptoms? Symptoms of this condition are side effects  of poor blood clotting. They will vary depending on how low the platelet counts are. Symptoms may include:  Abnormal bleeding.  Nosebleeds.  Heavy menstrual periods.  Blood in the urine or stool (feces).  A purplish discoloration in the skin (purpura).  Bruising.  A rash that looks like pinpoint, purplish-red spots (petechiae) on the skin and mucous membranes.  How is this diagnosed? This condition may be diagnosed with blood tests and a physical exam. Sometimes, a sample of bone marrow may be removed to look for the original cells (megakaryocytes) that make platelets. How is this treated? Treatment for this condition depends on the cause. Treatment options may include:  Treatment of another condition that is causing the low platelet count.  Medicines to help protect your platelets from being destroyed.  A replacement (transfusion) of platelets to stop or prevent bleeding.  Surgery to remove the spleen.  Follow these instructions at home: General instructions  Check your skin and the linings inside your mouth for bruising or bleeding as told by your health care provider.  Check your sputum, urine, and stool for blood as told by your health care provider.  Ask your health care provider if it is okay for you to drink alcohol.  Take over-the-counter and prescription medicines only as told by your health care provider.  Tell all of your health care providers, including dentists and eye doctors, about your condition. Activity  Until your health care provider says it is okay. ? Do not return to any activities that could cause bumps or bruises.  Take extra care not to cut yourself when you shave or when you use scissors, needles, knives, and other tools.  Take extra care not to burn yourself when ironing or cooking. Contact a health care provider if:  You have unexplained bruising. Get help right away if:  You have active bleeding from anywhere on your body.  You have  blood in your sputum, urine, or stool. This information is not intended to replace advice given to you by your health care provider. Make sure you discuss any questions you have with your health care provider. Document Released: 04/10/2005 Document Revised: 12/12/2015 Document Reviewed: 10/12/2014 Elsevier Interactive Patient Education  2018 [employer].

## 2017-05-27 ENCOUNTER — Encounter: Payer: Self-pay | Admitting: Internal Medicine

## 2017-05-27 NOTE — Progress Notes (Signed)
Chief Complaint  Patient presents with  . Follow-up   Follow up  1. Review labs  2. HTN elevated 144/70 repeat 136/78 on lisinopril 10 mg qd  3. Left sided pain above left ear resolved  4. Chronic thrombocytopenia per pt x 5 years so far unknown etiology and it has never been worked up per pt. He reports h/o Yaws as a child but that I am aware this is not related to thrombocytopenia. He does have a h/o prostate cancer s/p surgery in 2004    Review of Systems  Constitutional: Negative for weight loss.  HENT: Negative for ear pain.   Eyes:       No vision changes   Respiratory: Negative for shortness of breath.   Cardiovascular: Negative for chest pain.  Gastrointestinal: Negative for abdominal pain.  Musculoskeletal: Negative for falls.  Skin: Negative for rash.  Neurological: Negative for headaches.  Endo/Heme/Allergies:       +low plts x 5 years   Psychiatric/Behavioral: Negative for memory loss.   Past Medical History:  Diagnosis Date  . Cancer [facility])    prostate s/p removal 2004   . Diabetes mellitus without complication ([place])   . Hyperlipidemia   . Hypertension   . Type 2 diabetes mellitus with diabetic chronic kidney disease ([place]) 2007   Past Surgical History:  Procedure Laterality Date  . PROSTATE SURGERY  2004   Family History  Problem Relation Age of Onset  . Diabetes Mother   . Kidney disease Mother   . Hypertension Mother    Social History   Socioeconomic History  . Marital status: Married    Spouse name: Not on file  . Number of children: Not on file  . Years of education: Not on file  . Highest education level: Not on file  Social Needs  . Financial resource strain: Not on file  . Food insecurity - worry: Not on file  . Food insecurity - inability: Not on file  . Transportation needs - medical: Not on file  . Transportation needs - non-medical: Not on file  Occupational History  . Not on file  Tobacco Use  . Smoking status: Never Smoker  .  Smokeless tobacco: Never Used  Substance and Sexual Activity  . Alcohol use: No    Alcohol/week: 0.0 oz  . Drug use: No  . Sexual activity: Not Currently    Partners: Female  Other Topics Concern  . Not on file  Social History Narrative   From [place]    Current Meds  Medication Sig  . aspirin EC 81 MG tablet Take 81 mg by mouth daily.  Marland Kitchen glipiZIDE (GLUCOTROL) 5 MG tablet TAKE 1 TABLET (5 MG TOTAL) BY MOUTH DAILY BEFORE BREAKFAST.  Marland Kitchen Lancets MISC Please dispense One Touch Lancets. Check blood glucose once daily.  Marland Kitchen lisinopril (PRINIVIL,ZESTRIL) 20 MG tablet Take 1 tablet (20 mg total) by mouth daily.  . metFORMIN (GLUCOPHAGE) 500 MG tablet Take 1 tablet (500 mg total) by mouth 2 (two) times daily with a meal.  . Omega-3 Fatty Acids (FISH OIL) 1000 MG CAPS Take 1 capsule by mouth daily.  . ONE TOUCH ULTRA TEST test strip USE UP TO 4 TIMES DAILY AS DIRECTED  . [DISCONTINUED] lisinopril (PRINIVIL,ZESTRIL) 10 MG tablet Take 1 tablet (10 mg total) by mouth daily.   No Known Allergies Recent Results (from the past 2160 hour(s))  HIV antibody     Status: None   Collection Time: 05/11/17  8:38 AM  Result Value Ref Range   HIV 1&2 Ab, 4th Generation NON-REACTIVE NON-REACTI    Comment: HIV-1 antigen and HIV-1/HIV-2 antibodies were not detected. There is no laboratory evidence of HIV infection. Marland Kitchen PLEASE NOTE: This information has been disclosed to you from records whose confidentiality may be protected by state law.  If your state requires such protection, then the state law prohibits you from making any further disclosure of the information without the specific written consent of the person to whom it pertains, or as otherwise permitted by law. A general authorization for the release of medical or other information is NOT sufficient for this purpose. . For additional information please refer to http://education.questdiagnostics.com/faq/FAQ106 (This link is being provided for  informational/ educational purposes only.) . Marland Kitchen The performance of this assay has not been clinically validated in patients less than 12 years old. .   Hepatitis B surface antigen     Status: None   Collection Time: 05/11/17  8:38 AM  Result Value Ref Range   Hepatitis B Surface Ag NON-REACTIVE NON-REACTI  Hepatitis C antibody     Status: None   Collection Time: 05/11/17  8:38 AM  Result Value Ref Range   Hepatitis C Ab NON-REACTIVE NON-REACTI   SIGNAL TO CUT-OFF 0.02 <1.00  Hepatitis B core antibody, total     Status: Abnormal   Collection Time: 05/11/17  8:38 AM  Result Value Ref Range   Hep B Core Total Ab REACTIVE (A) NON-REACTI  Hepatitis B surface antibody     Status: None   Collection Time: 05/11/17  8:38 AM  Result Value Ref Range   Hepatitis B-Post 16 > OR = 10 mIU/mL    Comment: . Patient has immunity to hepatitis B virus. . For additional information, please refer to http://education.questdiagn[facility].com/faq/FAQ105 (This link is being provided for informational/ educational purposes only).   PSA     Status: Abnormal   Collection Time: 05/11/17  8:38 AM  Result Value Ref Range   PSA 0.00 (L) 0.10 - 4.00 ng/mL    Comment: Test performed using Access Hybritech PSA Assay, a parmagnetic partical, chemiluminecent immunoassay.  T4, free     Status: None   Collection Time: 05/11/17  8:38 AM  Result Value Ref Range   Free T4 0.89 0.60 - 1.60 ng/dL    Comment: Specimens from patients who are undergoing biotin therapy and /or ingesting biotin supplements may contain high levels of biotin.  The higher biotin concentration in these specimens interferes with this Free T4 assay.  Specimens that contain high levels  of biotin may cause false high results for this Free T4 assay.  Please interpret results in light of the total clinical presentation of the patient.    TSH     Status: None   Collection Time: 05/11/17  8:38 AM  Result Value Ref Range   TSH 1.15 0.35 - 4.50 uIU/mL   Hemoglobin A1c     Status: Abnormal   Collection Time: 05/11/17  8:38 AM  Result Value Ref Range   Hgb A1c MFr Bld 6.8 (H) 4.6 - 6.5 %    Comment: Glycemic Control Guidelines for People with Diabetes:Non Diabetic:  <6%Goal of Therapy: <7%Additional Action Suggested:  >8%   Urine Microalbumin w/creat. ratio     Status: Abnormal   Collection Time: 05/11/17  8:38 AM  Result Value Ref Range   Microalb, Ur 8.8 (H) 0.0 - 1.9 mg/dL   Creatinine,U 131.5 mg/dL   Microalb Creat Ratio 6.7 0.0 -  30.0 mg/g  Urinalysis, Routine w reflex microscopic     Status: Abnormal   Collection Time: 05/11/17  8:38 AM  Result Value Ref Range   Color, Urine YELLOW Yellow;Lt. Yellow   APPearance CLEAR Clear   Specific Gravity, Urine 1.020 1.000 - 1.030   pH 5.5 5.0 - 8.0   Total Protein, Urine NEGATIVE Negative   Urine Glucose NEGATIVE Negative   Ketones, ur NEGATIVE Negative   Bilirubin Urine NEGATIVE Negative   Hgb urine dipstick NEGATIVE Negative   Urobilinogen, UA 0.2 0.0 - 1.0   Leukocytes, UA NEGATIVE Negative   Nitrite NEGATIVE Negative   WBC, UA 3-6/hpf (A) 0-2/hpf   RBC / HPF none seen 0-2/hpf   Mucus, UA Presence of (A) None   Squamous Epithelial / LPF Rare(0-4/hpf) Rare(0-4/hpf)   Renal Epithel, UA Rare(0-4/hpf) (A) None   Bacteria, UA Few(10-50/hpf) (A) None  Comprehensive metabolic panel     Status: Abnormal   Collection Time: 05/11/17  8:38 AM  Result Value Ref Range   Sodium 140 135 - 145 mEq/L   Potassium 4.4 3.5 - 5.1 mEq/L   Chloride 102 96 - 112 mEq/L   CO2 31 19 - 32 mEq/L   Glucose, Bld 116 (H) 70 - 99 mg/dL   BUN 19 6 - 23 mg/dL   Creatinine, Ser 1.19 0.40 - 1.50 mg/dL   Total Bilirubin 0.5 0.2 - 1.2 mg/dL   Alkaline Phosphatase 78 39 - 117 U/L   AST 18 0 - 37 U/L   ALT 11 0 - 53 U/L   Total Protein 7.7 6.0 - 8.3 g/dL   Albumin 4.2 3.5 - 5.2 g/dL   Calcium 9.7 8.4 - 10.5 mg/dL   GFR 76.18 >60.00 mL/min  Lipid panel     Status: Abnormal   Collection Time: 05/11/17  8:38  AM  Result Value Ref Range   Cholesterol 175 0 - 200 mg/dL    Comment: ATP III Classification       Desirable:  < 200 mg/dL               Borderline High:  200 - 239 mg/dL          High:  > = 240 mg/dL   Triglycerides 112.0 0.0 - 149.0 mg/dL    Comment: Normal:  <150 mg/dLBorderline High:  150 - 199 mg/dL   HDL 39.10 >39.00 mg/dL   VLDL 22.4 0.0 - 40.0 mg/dL   LDL Cholesterol 114 (H) 0 - 99 mg/dL   Total CHOL/HDL Ratio 4     Comment:                Men          Women1/2 Average Risk     3.4          3.3Average Risk          5.0          4.42X Average Risk          9.6          7.13X Average Risk          15.0          11.0                       NonHDL 136.35     Comment: NOTE:  Non-HDL goal should be 30 mg/dL higher than patient's LDL goal (i.e. LDL goal of < 70 mg/dL, would have  non-HDL goal of < 100 mg/dL)  CBC with Differential/Platelet     Status: Abnormal   Collection Time: 05/11/17  8:38 AM  Result Value Ref Range   WBC 4.6 4.0 - 10.5 K/uL   RBC 5.10 4.22 - 5.81 Mil/uL   Hemoglobin 13.7 13.0 - 17.0 g/dL   HCT 43.1 39.0 - 52.0 %   MCV 84.5 78.0 - 100.0 fl   MCHC 31.9 30.0 - 36.0 g/dL   RDW 13.7 11.5 - 15.5 %   Platelets 136.0 (L) 150.0 - 400.0 K/uL   Neutrophils Relative % 45.5 43.0 - 77.0 %   Lymphocytes Relative 39.4 12.0 - 46.0 %   Monocytes Relative 11.5 3.0 - 12.0 %   Eosinophils Relative 3.3 0.0 - 5.0 %   Basophils Relative 0.3 0.0 - 3.0 %   Neutro Abs 2.1 1.4 - 7.7 K/uL   Lymphs Abs 1.8 0.7 - 4.0 K/uL   Monocytes Absolute 0.5 0.1 - 1.0 K/uL   Eosinophils Absolute 0.2 0.0 - 0.7 K/uL   Basophils Absolute 0.0 0.0 - 0.1 K/uL   Objective  Body mass index is 27 kg/m. Wt Readings from Last 3 Encounters:  05/24/17 172 lb 6.4 oz (78.2 kg)  05/11/17 169 lb (76.7 kg)  05/01/17 172 lb (78 kg)   Temp Readings from Last 3 Encounters:  05/24/17 98 F (36.7 C) (Oral)  05/11/17 98 F (36.7 C) (Oral)  05/01/17 98.2 F (36.8 C) (Oral)   BP Readings from Last 3 Encounters:   05/24/17 (!) 144/76  05/11/17 128/68  05/01/17 (!) 148/82   Pulse Readings from Last 3 Encounters:  05/24/17 74  05/11/17 65  05/01/17 80   O2 sat room air 97% Physical Exam  Constitutional: He is oriented to person, place, and time and well-developed, well-nourished, and in no distress.  HENT:  Head: Normocephalic and atraumatic.  Mouth/Throat: Oropharynx is clear and moist and mucous membranes are normal.  Eyes: Conjunctivae are normal. Pupils are equal, round, and reactive to light.  Cardiovascular: Normal rate and regular rhythm.  Murmur heard. Pulmonary/Chest: Effort normal and breath sounds normal.  Abdominal: There is no tenderness.  Neurological: He is alert and oriented to person, place, and time. Gait normal. Gait normal.  Skin: Skin is warm, dry and intact.  Psychiatric: Mood, memory, affect and judgment normal.  Nursing note and vitals reviewed.   Assessment   1. Persistent Thrombocytopenia ? Etiology pt does have personal h/o prostate cancer s/p surgery in 2004. PSA 0.00. Will r/o hypersplenism with [place]. HIV, hep C neg, Hep B core total + with immunity likely 2/2 natural infection  2. HTN slightly elevated/HLD uncontrolled LDL 114 3. Cardiac murmur  4. DM 2 A1C 6.8 05/11/17 with proteinuria  5. HM Plan  1.  US abdomen  Refer to H/O Dr. Grayland Ormond  2. Increase Lisinopril 10 mg to 20 mg qd  He is not on statin with h/o DM consider statin in future  3. Ordered echo to assess valves  4. Cont metformin 500 mg bid  Glipizide 5 mg qam  Eye exam Monday upcoming  Do foot exam in future  Urine +protein 04/2017   5. Declines flu shot  Had Tdap  He is unsure which vaccine he has had 2-3 years ago pna 23 or prevnar and may have had at another M[facility] office  Consider shingrix in future  Hep B immune likely 2/2 natural infection core total +  PSA with h/o prostate cancer 0.00 Consider cologaurd in  future    Provider: Dr. Olivia Mackie McLean-Scocuzza-Internal Medicine

## 2017-05-28 DIAGNOSIS — H2513 Age-related nuclear cataract, bilateral: Secondary | ICD-10-CM | POA: Diagnosis not present

## 2017-05-28 DIAGNOSIS — H524 Presbyopia: Secondary | ICD-10-CM | POA: Diagnosis not present

## 2017-05-28 DIAGNOSIS — H5203 Hypermetropia, bilateral: Secondary | ICD-10-CM | POA: Diagnosis not present

## 2017-05-28 DIAGNOSIS — E119 Type 2 diabetes mellitus without complications: Secondary | ICD-10-CM | POA: Diagnosis not present

## 2017-05-30 ENCOUNTER — Telehealth: Payer: Self-pay | Admitting: Internal Medicine

## 2017-05-30 ENCOUNTER — Ambulatory Visit
Admission: RE | Admit: 2017-05-30 | Discharge: 2017-05-30 | Disposition: A | Discharge status: Home / Self Care | Payer: PPO | Source: Ambulatory Visit | Attending: Internal Medicine | Admitting: Internal Medicine

## 2017-05-30 DIAGNOSIS — D696 Thrombocytopenia, unspecified: Secondary | ICD-10-CM

## 2017-05-30 NOTE — Telephone Encounter (Signed)
fyi

## 2017-05-30 NOTE — Telephone Encounter (Signed)
Ok as long as rescheduled  [facility]

## 2017-05-30 NOTE — Telephone Encounter (Signed)
Copied from [place] [id number]. Topic: Quick Communication - See Telephone Encounter >> May 30, 2017 10:40 AM Cleaster Corin, NT wrote: CRM for notification. See Telephone encounter for:   05/30/17. Parker from [facility] ultrasound wanted to let Dr. Mathews Argyle know that pt. Had to reschedule ultra sound due to eating this morning. Jerline Pain can be reached at [phone]

## 2017-05-30 NOTE — Telephone Encounter (Signed)
FYI

## 2017-05-31 ENCOUNTER — Ambulatory Visit
Admission: RE | Admit: 2017-05-31 | Discharge: 2017-05-31 | Disposition: A | Discharge status: Home / Self Care | Payer: PPO | Source: Ambulatory Visit | Attending: Internal Medicine | Admitting: Internal Medicine

## 2017-05-31 DIAGNOSIS — N281 Cyst of kidney, acquired: Secondary | ICD-10-CM | POA: Diagnosis not present

## 2017-05-31 DIAGNOSIS — D696 Thrombocytopenia, unspecified: Secondary | ICD-10-CM | POA: Insufficient documentation

## 2017-06-01 ENCOUNTER — Telehealth: Payer: Self-pay | Admitting: Internal Medicine

## 2017-06-01 NOTE — Progress Notes (Signed)
[facility]  [phone] [phone]  ID: [name] OB: 01/17/1941  MR#: [id number]  AVW#:[id number]  Patient Care Team: McLean-Scocuzza, Nino Glow, MD as PCP - General (Internal Medicine) Vidal Schwalbe Yvetta Coder, FNP as Nurse Practitioner (Family Medicine) Bary Castilla Forest Gleason, MD (General Surgery)  CHIEF COMPLAINT: Thrombocytopenia.  INTERVAL HISTORY: Patient is a 76 year old male who was noted to have a persistently decreased platelet count on routine blood work.  He is anxious, but otherwise feels well.  He has no neurologic complaints.  He denies any recent fevers or illnesses.  He has a good appetite and denies weight loss.  He denies any easy bleeding or bruising.  He has no chest pain or shortness of breath.  He denies any nausea, vomiting, constipation, or diarrhea.  He has no urinary complaints.  Patient feels at his baseline offers no further specific complaints today.  REVIEW OF SYSTEMS:   Review of Systems  Constitutional: Negative.  Negative for fever, malaise/fatigue and weight loss.  Respiratory: Negative for cough, hemoptysis and shortness of breath.   Cardiovascular: Negative.  Negative for chest pain and leg swelling.  Gastrointestinal: Negative.  Negative for abdominal pain, blood in stool and melena.  Genitourinary: Negative.  Negative for dysuria.  Musculoskeletal: Negative.   Skin: Negative.  Negative for rash.  Neurological: Negative.  Negative for weakness.  Psychiatric/Behavioral: Negative.  The patient is not nervous/anxious.     As per HPI. Otherwise, a complete review of systems is negative.  PAST MEDICAL HISTORY: Past Medical History:  Diagnosis Date  . Cancer [facility])    prostate s/p removal 2004   . Diabetes mellitus without complication ([place])   . Hyperlipidemia   . Hypertension   . Type 2 diabetes mellitus with diabetic chronic kidney disease ([place]) 2007  . Yaws    childhood; +RPR 2/2 to this history in the past      PAST SURGICAL HISTORY: Past Surgical History:  Procedure Laterality Date  . PROSTATE SURGERY  2004    FAMILY HISTORY: Family History  Problem Relation Age of Onset  . Diabetes Mother   . Kidney disease Mother   . Hypertension Mother     ADVANCED DIRECTIVES (Y/N):  N  HEALTH MAINTENANCE: Social History   Tobacco Use  . Smoking status: Never Smoker  . Smokeless tobacco: Never Used  Substance Use Topics  . Alcohol use: No    Alcohol/week: 0.0 oz  . Drug use: No     Colonoscopy:  PAP:  Bone density:  Lipid panel:  No Known Allergies  Current Outpatient Medications  Medication Sig Dispense Refill  . aspirin EC 81 MG tablet Take 81 mg by mouth daily.    Marland Kitchen glipiZIDE (GLUCOTROL) 5 MG tablet TAKE 1 TABLET (5 MG TOTAL) BY MOUTH DAILY BEFORE BREAKFAST. 90 tablet 1  . Lancets MISC Please dispense One Touch Lancets. Check blood glucose once daily. 100 each 11  . lisinopril (PRINIVIL,ZESTRIL) 20 MG tablet Take 1 tablet (20 mg total) by mouth daily. 90 tablet 1  . metFORMIN (GLUCOPHAGE) 500 MG tablet Take 1 tablet (500 mg total) by mouth 2 (two) times daily with a meal. 180 tablet 3  . Omega-3 Fatty Acids (FISH OIL) 1000 MG CAPS Take 1 capsule by mouth daily.    . ONE TOUCH ULTRA TEST test strip USE UP TO 4 TIMES DAILY AS DIRECTED 100 each 3   No current facility-administered medications for this visit.     OBJECTIVE: Vitals:   06/04/17 1513  BP: (!) 155/82  Pulse: 77  Resp: 18  Temp: (!) 97.5 F (36.4 C)     Body mass index is 26.66 kg/m.    ECOG FS:0 - Asymptomatic  General: Well-developed, well-nourished, no acute distress. Eyes: Pink conjunctiva, anicteric sclera. HEENT: Normocephalic, moist mucous membranes, clear oropharnyx. Lungs: Clear to auscultation bilaterally. Heart: Regular rate and rhythm. No rubs, murmurs, or gallops. Abdomen: Soft, nontender, nondistended. No organomegaly noted, normoactive bowel sounds. Musculoskeletal: No edema, cyanosis, or  clubbing. Neuro: Alert, answering all questions appropriately. Cranial nerves grossly intact. Skin: No rashes or petechiae noted. Psych: Normal affect. Lymphatics: No cervical, calvicular, axillary or inguinal LAD.   LAB RESULTS:  Lab Results  Component Value Date   NA 140 05/11/2017   K 4.4 05/11/2017   CL 102 05/11/2017   CO2 31 05/11/2017   GLUCOSE 116 (H) 05/11/2017   BUN 19 05/11/2017   CREATININE 1.19 05/11/2017   CALCIUM 9.7 05/11/2017   PROT 7.7 05/11/2017   ALBUMIN 4.2 05/11/2017   AST 18 05/11/2017   ALT 11 05/11/2017   ALKPHOS 78 05/11/2017   BILITOT 0.5 05/11/2017   GFRNONAA 55 (L) 02/10/2016   GFRAA 63 02/10/2016    Lab Results  Component Value Date   WBC 5.2 06/04/2017   NEUTROABS 2.1 05/11/2017   HGB 13.4 06/04/2017   HCT 41.5 06/04/2017   MCV 83.2 06/04/2017   PLT 128 (L) 06/04/2017     STUDIES: US Abdomen Complete  Result Date: 05/31/2017 CLINICAL DATA:  Thrombocytopenia EXAM: ABDOMEN ULTRASOUND COMPLETE COMPARISON:  None. FINDINGS: Gallbladder: The gallbladder is well visualized and no gallstones are noted. Common bile duct: Diameter: Common bile duct is normal measuring 3.1 mm in diameter. Liver: The liver has a normal echogenic pattern. No focal hepatic abnormality seen other than a small cyst in the left lobe of 9 mm in diameter. Portal vein is patent on color Doppler imaging with normal direction of blood flow towards the liver. IVC: No abnormality visualized. Pancreas: The pancreas is moderately well seen with only the very distal tail partially obscured. Spleen: The spleen measures 6.0 cm. Right Kidney: Length: 9.6 cm.  No hydronephrosis is seen. Left Kidney: Length: 10.3 cm. No hydronephrosis is noted. A tiny cyst is noted laterally in the mid left kidney of 7 mm in diameter. Abdominal aorta: The abdominal aorta is normal in caliber. Other findings: None. IMPRESSION: 1. No gallstones.  No ductal dilatation. 2. No hepatic abnormality.  9 mm cyst in  the left lobe. 3. The pancreas is moderately well seen with no abnormality noted Electronically Signed   By: Ivar Drape M.D.   On: 05/31/2017 13:12    ASSESSMENT: Thrombocytopenia, MGUS.  PLAN:  1. Thrombocytopenia: Patient's platelet count remains persistently decreased at 128, but relatively unchanged.  The remainder of his laboratory work is either negative or within normal limits except a mildly increased M spike on SPEP.  No intervention is needed at this time.  Return to clinic in 3 months with repeat laboratory work and further evaluation. 2.  MGUS: Patient noted to have an M spike of 0.9 on SPEP.  He has a mild thrombocytopenia, but no other evidence of endorgan damage.  No intervention is needed at this time.  Patient does not require bone marrow biopsy or metastatic bone survey, but would consider one if there is suspicion of progression of disease.  Return to clinic as above with repeat laboratory work and further evaluation.  Approximately 45 minutes was spent in  discussion of which greater than 50% was consultation.  Patient expressed understanding and was in agreement with this plan. He also understands that He can call clinic at any time with any questions, concerns, or complaints.    Lloyd Huger, MD   06/08/2017 10:09 AM

## 2017-06-01 NOTE — Telephone Encounter (Signed)
Please advise 

## 2017-06-01 NOTE — Telephone Encounter (Signed)
Copied from [place] [id number]. Topic: Quick Communication - See Telephone Encounter >> Jun 01, 2017  2:40 PM Bea Graff, NT wrote: CRM for notification. See Telephone encounter for: Pt would like a call from Dr. Terese Door asap. Would not disclose as to why he needed to speak to her.  06/01/17.

## 2017-06-01 NOTE — Telephone Encounter (Signed)
Spoke with pt informed us report  Thanks Learned

## 2017-06-04 ENCOUNTER — Other Ambulatory Visit: Payer: Self-pay

## 2017-06-04 ENCOUNTER — Inpatient Hospital Stay: Payer: PPO

## 2017-06-04 ENCOUNTER — Inpatient Hospital Stay: Payer: PPO | Attending: Oncology | Admitting: Oncology

## 2017-06-04 VITALS — BP 155/82 | HR 77 | Temp 97.5°F | Resp 18 | Wt 170.2 lb

## 2017-06-04 DIAGNOSIS — D472 Monoclonal gammopathy: Secondary | ICD-10-CM | POA: Diagnosis not present

## 2017-06-04 DIAGNOSIS — E119 Type 2 diabetes mellitus without complications: Secondary | ICD-10-CM | POA: Diagnosis not present

## 2017-06-04 DIAGNOSIS — D696 Thrombocytopenia, unspecified: Secondary | ICD-10-CM | POA: Diagnosis not present

## 2017-06-04 DIAGNOSIS — N189 Chronic kidney disease, unspecified: Secondary | ICD-10-CM | POA: Insufficient documentation

## 2017-06-04 DIAGNOSIS — I129 Hypertensive chronic kidney disease with stage 1 through stage 4 chronic kidney disease, or unspecified chronic kidney disease: Secondary | ICD-10-CM | POA: Insufficient documentation

## 2017-06-04 DIAGNOSIS — E1122 Type 2 diabetes mellitus with diabetic chronic kidney disease: Secondary | ICD-10-CM | POA: Diagnosis not present

## 2017-06-04 DIAGNOSIS — N281 Cyst of kidney, acquired: Secondary | ICD-10-CM | POA: Diagnosis not present

## 2017-06-04 LAB — FERRITIN: FERRITIN: 41 ng/mL (ref 24–336)

## 2017-06-04 LAB — IRON AND TIBC
Iron: 55 ug/dL (ref 45–182)
SATURATION RATIOS: 19 % (ref 17.9–39.5)
TIBC: 322 ug/dL (ref 250–450)
UIBC: 234 ug/dL

## 2017-06-04 LAB — CBC
HCT: 41.5 % (ref 40.0–52.0)
Hemoglobin: 13.4 g/dL (ref 13.0–18.0)
MCH: 26.9 pg (ref 26.0–34.0)
MCHC: 32.3 g/dL (ref 32.0–36.0)
MCV: 83.2 fL (ref 80.0–100.0)
PLATELETS: 128 10*3/uL — AB (ref 150–440)
RBC: 4.99 MIL/uL (ref 4.40–5.90)
RDW: 13.6 % (ref 11.5–14.5)
WBC: 5.2 10*3/uL (ref 3.8–10.6)

## 2017-06-04 LAB — FOLATE: Folate: 17.1 ng/mL (ref >5.9)

## 2017-06-04 LAB — VITAMIN B12: Vitamin B-12: 262 pg/mL (ref 180–914)

## 2017-06-04 NOTE — Progress Notes (Signed)
Pt in today for new patient visit for thrombocytopenia.

## 2017-06-05 LAB — PLATELET ANTIBODY PROFILE
Glycoprotein IV Antibody: NEGATIVE
HLA Ab Ser Ql EIA: NEGATIVE
IA/IIA ANTIBODY: NEGATIVE
IB/IX ANTIBODY: NEGATIVE
IIB/IIIA Antibody: NEGATIVE

## 2017-06-07 LAB — PROTEIN ELECTROPHORESIS, SERUM, WITH REFLEX
A/G Ratio: 1 (ref 0.7–1.7)
ALPHA-1-GLOBULIN: 0.2 g/dL (ref 0.0–0.4)
ALPHA-2-GLOBULIN: 0.7 g/dL (ref 0.4–1.0)
Albumin ELP: 3.8 g/dL (ref 2.9–4.4)
Beta Globulin: 1 g/dL (ref 0.7–1.3)
Gamma Globulin: 2 g/dL — ABNORMAL HIGH (ref 0.4–1.8)
Globulin, Total: 3.8 g/dL (ref 2.2–3.9)
M-Spike, %: 0.9 g/dL — ABNORMAL HIGH
SPEP INTERP: 0
Total Protein ELP: 7.6 g/dL (ref 6.0–8.5)

## 2017-06-07 LAB — IMMUNOFIXATION REFLEX, SERUM
IGA: 238 mg/dL (ref 61–437)
IGG (IMMUNOGLOBIN G), SERUM: 2576 mg/dL — AB (ref 700–1600)
IGM (IMMUNOGLOBULIN M), SRM: 78 mg/dL (ref 15–143)

## 2017-06-14 ENCOUNTER — Ambulatory Visit
Admission: RE | Admit: 2017-06-14 | Discharge: 2017-06-14 | Disposition: A | Discharge status: Home / Self Care | Payer: PPO | Source: Ambulatory Visit | Attending: Internal Medicine | Admitting: Internal Medicine

## 2017-06-14 DIAGNOSIS — E785 Hyperlipidemia, unspecified: Secondary | ICD-10-CM | POA: Diagnosis not present

## 2017-06-14 DIAGNOSIS — I10 Essential (primary) hypertension: Secondary | ICD-10-CM | POA: Insufficient documentation

## 2017-06-14 DIAGNOSIS — E119 Type 2 diabetes mellitus without complications: Secondary | ICD-10-CM | POA: Insufficient documentation

## 2017-06-14 DIAGNOSIS — I071 Rheumatic tricuspid insufficiency: Secondary | ICD-10-CM | POA: Insufficient documentation

## 2017-06-14 DIAGNOSIS — R011 Cardiac murmur, unspecified: Secondary | ICD-10-CM | POA: Diagnosis not present

## 2017-06-14 NOTE — Progress Notes (Signed)
*  PRELIMINARY RESULTS* Echocardiogram 2D Echocardiogram has been performed.  Daniel Carney 06/14/2017, 11:36 AM

## 2017-08-10 ENCOUNTER — Other Ambulatory Visit: Payer: Self-pay | Admitting: Family Medicine

## 2017-08-28 ENCOUNTER — Inpatient Hospital Stay: Payer: PPO | Attending: Oncology

## 2017-08-28 DIAGNOSIS — D472 Monoclonal gammopathy: Secondary | ICD-10-CM | POA: Insufficient documentation

## 2017-08-28 DIAGNOSIS — I10 Essential (primary) hypertension: Secondary | ICD-10-CM | POA: Insufficient documentation

## 2017-08-28 DIAGNOSIS — N189 Chronic kidney disease, unspecified: Secondary | ICD-10-CM | POA: Insufficient documentation

## 2017-08-28 DIAGNOSIS — D696 Thrombocytopenia, unspecified: Secondary | ICD-10-CM | POA: Diagnosis not present

## 2017-08-28 DIAGNOSIS — E119 Type 2 diabetes mellitus without complications: Secondary | ICD-10-CM | POA: Diagnosis not present

## 2017-08-28 DIAGNOSIS — I129 Hypertensive chronic kidney disease with stage 1 through stage 4 chronic kidney disease, or unspecified chronic kidney disease: Secondary | ICD-10-CM | POA: Diagnosis not present

## 2017-08-28 LAB — BASIC METABOLIC PANEL
ANION GAP: 8 (ref 5–15)
BUN: 15 mg/dL (ref 6–20)
CALCIUM: 9.5 mg/dL (ref 8.9–10.3)
CO2: 26 mmol/L (ref 22–32)
CREATININE: 1.19 mg/dL (ref 0.61–1.24)
Chloride: 104 mmol/L (ref 101–111)
GFR calc non Af Amer: 57 mL/min — ABNORMAL LOW (ref >60)
Glucose, Bld: 224 mg/dL — ABNORMAL HIGH (ref 65–99)
Potassium: 4 mmol/L (ref 3.5–5.1)
Sodium: 138 mmol/L (ref 135–145)

## 2017-08-29 LAB — PROTEIN ELECTROPHORESIS, SERUM
A/G RATIO SPE: 1.2 (ref 0.7–1.7)
ALBUMIN ELP: 3.8 g/dL (ref 2.9–4.4)
Alpha-1-Globulin: 0.2 g/dL (ref 0.0–0.4)
Alpha-2-Globulin: 0.6 g/dL (ref 0.4–1.0)
Beta Globulin: 0.9 g/dL (ref 0.7–1.3)
Gamma Globulin: 1.6 g/dL (ref 0.4–1.8)
Globulin, Total: 3.3 g/dL (ref 2.2–3.9)
M-Spike, %: 0.8 g/dL — ABNORMAL HIGH
TOTAL PROTEIN ELP: 7.1 g/dL (ref 6.0–8.5)

## 2017-08-29 LAB — PROTEIN ELECTRO, RANDOM URINE
ALBUMIN ELP UR: 100 %
ALPHA-1-GLOBULIN, U: 0 %
ALPHA-2-GLOBULIN, U: 0 %
Beta Globulin, U: 0 %
Gamma Globulin, U: 0 %
Total Protein, Urine: 38.9 mg/dL

## 2017-08-29 LAB — KAPPA/LAMBDA LIGHT CHAINS
KAPPA, LAMDA LIGHT CHAIN RATIO: 3.32 — AB (ref 0.26–1.65)
Kappa free light chain: 55.8 mg/L — ABNORMAL HIGH (ref 3.3–19.4)
Lambda free light chains: 16.8 mg/L (ref 5.7–26.3)

## 2017-08-29 LAB — IGG, IGA, IGM
IGG (IMMUNOGLOBIN G), SERUM: 1849 mg/dL — AB (ref 700–1600)
IgA: 179 mg/dL (ref 61–437)
IgM (Immunoglobulin M), Srm: 55 mg/dL (ref 15–143)

## 2017-09-02 NOTE — Progress Notes (Signed)
[place]  Telephone:(336) [phone] Fax:(336) [phone]  ID: Daniel Carney OB: 1941/01/12  MR#: [id number]  GEZ#:[id number]  Patient Care Team: McLean-Scocuzza, Nino Glow, MD as PCP - General (Internal Medicine) Vidal Schwalbe Yvetta Coder, FNP as Nurse Practitioner (Family Medicine) Bary Castilla Forest Gleason, MD (General Surgery)  CHIEF COMPLAINT: Thrombocytopenia, MGUS.  INTERVAL HISTORY: Patient returns to clinic today for repeat laboratory work and further evaluation.  He continues to feel well and remains asymptomatic.  He has no neurologic complaints.  He denies any recent fevers or illnesses.  He denies any bony pain.  He has a good appetite and denies weight loss.  He denies any easy bleeding or bruising.  He has no chest pain or shortness of breath.  He denies any nausea, vomiting, constipation, or diarrhea.  He has no urinary complaints.  Patient offers no specific complaints today.  REVIEW OF SYSTEMS:   Review of Systems  Constitutional: Negative.  Negative for fever, malaise/fatigue and weight loss.  Respiratory: Negative for cough, hemoptysis and shortness of breath.   Cardiovascular: Negative.  Negative for chest pain and leg swelling.  Gastrointestinal: Negative.  Negative for abdominal pain, blood in stool and melena.  Genitourinary: Negative.  Negative for dysuria.  Musculoskeletal: Negative.   Skin: Negative.  Negative for rash.  Neurological: Negative.  Negative for weakness.  Psychiatric/Behavioral: Negative.  The patient is not nervous/anxious.     As per HPI. Otherwise, a complete review of systems is negative.  PAST MEDICAL HISTORY: Past Medical History:  Diagnosis Date  . Cancer [facility])    prostate s/p removal 2004   . Diabetes mellitus without complication ([place])   . Hyperlipidemia   . Hypertension   . Type 2 diabetes mellitus with diabetic chronic kidney disease ([place]) 2007  . Yaws    childhood; +RPR 2/2 to this history in the past     PAST SURGICAL  HISTORY: Past Surgical History:  Procedure Laterality Date  . PROSTATE SURGERY  2004    FAMILY HISTORY: Family History  Problem Relation Age of Onset  . Diabetes Mother   . Kidney disease Mother   . Hypertension Mother     ADVANCED DIRECTIVES (Y/N):  N  HEALTH MAINTENANCE: Social History   Tobacco Use  . Smoking status: Never Smoker  . Smokeless tobacco: Never Used  Substance Use Topics  . Alcohol use: No    Alcohol/week: 0.0 oz  . Drug use: No     Colonoscopy:  PAP:  Bone density:  Lipid panel:  No Known Allergies  Current Outpatient Medications  Medication Sig Dispense Refill  . aspirin EC 81 MG tablet Take 81 mg by mouth daily.    Marland Kitchen glipiZIDE (GLUCOTROL) 5 MG tablet TAKE 1 TABLET (5 MG TOTAL) BY MOUTH DAILY BEFORE BREAKFAST. 90 tablet 1  . lisinopril (PRINIVIL,ZESTRIL) 20 MG tablet Take 1 tablet (20 mg total) by mouth daily. 90 tablet 1  . metFORMIN (GLUCOPHAGE) 500 MG tablet TAKE 1 TABLET (500 MG TOTAL) BY MOUTH 2 (TWO) TIMES DAILY WITH A MEAL. 180 tablet 3  . Omega-3 Fatty Acids (FISH OIL) 1000 MG CAPS Take 1 capsule by mouth daily.    . Lancets MISC Please dispense One Touch Lancets. Check blood glucose once daily. (Patient not taking: Reported on 09/04/2017) 100 each 11  . ONE TOUCH ULTRA TEST test strip USE UP TO 4 TIMES DAILY AS DIRECTED (Patient not taking: Reported on 09/04/2017) 100 each 3   No current facility-administered medications for this visit.  OBJECTIVE: Vitals:   09/04/17 1346  BP: (!) 178/94  Pulse: 73  Resp: 18  Temp: (!) 96.2 F (35.7 C)     Body mass index is 27.16 kg/m.    ECOG FS:0 - Asymptomatic  General: Well-developed, well-nourished, no acute distress. Eyes: Pink conjunctiva, anicteric sclera. Lungs: Clear to auscultation bilaterally. Heart: Regular rate and rhythm. No rubs, murmurs, or gallops. Abdomen: Soft, nontender, nondistended. No organomegaly noted, normoactive bowel sounds. Musculoskeletal: No edema, cyanosis,  or clubbing. Neuro: Alert, answering all questions appropriately. Cranial nerves grossly intact. Skin: No rashes or petechiae noted. Psych: Normal affect.   LAB RESULTS:  Lab Results  Component Value Date   NA 138 08/28/2017   K 4.0 08/28/2017   CL 104 08/28/2017   CO2 26 08/28/2017   GLUCOSE 224 (H) 08/28/2017   BUN 15 08/28/2017   CREATININE 1.19 08/28/2017   CALCIUM 9.5 08/28/2017   PROT 7.7 05/11/2017   ALBUMIN 4.2 05/11/2017   AST 18 05/11/2017   ALT 11 05/11/2017   ALKPHOS 78 05/11/2017   BILITOT 0.5 05/11/2017   GFRNONAA 57 (L) 08/28/2017   GFRAA >60 08/28/2017    Lab Results  Component Value Date   WBC 5.2 06/04/2017   NEUTROABS 2.1 05/11/2017   HGB 13.4 06/04/2017   HCT 41.5 06/04/2017   MCV 83.2 06/04/2017   PLT 128 (L) 06/04/2017   Lab Results  Component Value Date   TOTALPROTELP 7.1 08/28/2017   ALBUMINELP 3.8 08/28/2017   A1GS 0.2 08/28/2017   A2GS 0.6 08/28/2017   BETS 0.9 08/28/2017   GAMS 1.6 08/28/2017   MSPIKE 0.8 (H) 08/28/2017   SPEI Comment 08/28/2017     STUDIES: No results found.  ASSESSMENT: Thrombocytopenia, MGUS.  PLAN:  1. Thrombocytopenia: Patient platelet count remains decreased, but essentially unchanged and stable.  Previously other than the mildly increased M spike on SPEP all of his other laboratory work was either negative or within normal limits.  No intervention is needed at this time.  Patient does not require bone marrow biopsy.  Return to clinic in 6 months with repeat laboratory for further evaluation.   2.  MGUS: Patient's M spike on SPEP is 0.8 which is essentially unchanged from 3 months ago.  UPEP is negative.  He has a mildly elevated IgG component of 1849, which is decreased since February.  His kappa free light chains are also mildly elevated at 55.8.   He has a mild thrombocytopenia, but no other evidence of endorgan damage.  No intervention is needed at this time.  Patient does not require bone marrow biopsy or  metastatic bone survey, but would consider if there is suspicion of progression of disease.  Return to clinic as above with repeat laboratory work and further evaluation.  Approximate 20 minutes spent in discussion of 50% was consultation.  Patient expressed understanding and was in agreement with this plan. He also understands that He can call clinic at any time with any questions, concerns, or complaints.    Daniel Huger, MD   09/05/2017 10:10 AM

## 2017-09-04 ENCOUNTER — Other Ambulatory Visit: Payer: PPO

## 2017-09-04 ENCOUNTER — Other Ambulatory Visit: Payer: Self-pay

## 2017-09-04 ENCOUNTER — Encounter: Payer: Self-pay | Admitting: Oncology

## 2017-09-04 ENCOUNTER — Inpatient Hospital Stay (HOSPITAL_BASED_OUTPATIENT_CLINIC_OR_DEPARTMENT_OTHER): Payer: PPO | Admitting: Oncology

## 2017-09-04 VITALS — BP 178/94 | HR 73 | Temp 96.2°F | Resp 18 | Wt 173.4 lb

## 2017-09-04 DIAGNOSIS — N189 Chronic kidney disease, unspecified: Secondary | ICD-10-CM

## 2017-09-04 DIAGNOSIS — D472 Monoclonal gammopathy: Secondary | ICD-10-CM | POA: Insufficient documentation

## 2017-09-04 DIAGNOSIS — D696 Thrombocytopenia, unspecified: Secondary | ICD-10-CM

## 2017-09-04 DIAGNOSIS — E119 Type 2 diabetes mellitus without complications: Secondary | ICD-10-CM | POA: Diagnosis not present

## 2017-09-04 DIAGNOSIS — I10 Essential (primary) hypertension: Secondary | ICD-10-CM

## 2017-09-04 NOTE — Progress Notes (Signed)
Here for follow up . Per pt " Im doing very well "

## 2017-09-11 ENCOUNTER — Other Ambulatory Visit: Payer: Self-pay | Admitting: Family

## 2017-11-15 ENCOUNTER — Other Ambulatory Visit: Payer: Self-pay | Admitting: Family Medicine

## 2017-11-15 DIAGNOSIS — E1122 Type 2 diabetes mellitus with diabetic chronic kidney disease: Secondary | ICD-10-CM

## 2017-11-26 ENCOUNTER — Telehealth: Payer: Self-pay | Admitting: Internal Medicine

## 2017-11-26 DIAGNOSIS — I10 Essential (primary) hypertension: Secondary | ICD-10-CM

## 2017-11-26 DIAGNOSIS — E1122 Type 2 diabetes mellitus with diabetic chronic kidney disease: Secondary | ICD-10-CM

## 2017-11-26 MED ORDER — LISINOPRIL 20 MG PO TABS
20.0000 mg | ORAL_TABLET | Freq: Every day | ORAL | 1 refills | Status: DC
Start: 2017-11-26 — End: 2018-04-23

## 2017-11-26 MED ORDER — GLIPIZIDE 5 MG PO TABS: 5.0000 mg | ORAL_TABLET | Freq: Every day | ORAL | 1 refills | Status: DC

## 2017-11-26 NOTE — Telephone Encounter (Signed)
PT was in office and needs this filled

## 2017-11-26 NOTE — Telephone Encounter (Signed)
Last OV 05/24/17 last filled by Dr.Cook

## 2017-11-26 NOTE — Telephone Encounter (Signed)
Medication has been refilled.

## 2017-11-26 NOTE — Telephone Encounter (Signed)
Pt also needs his lisinopril (PRINIVIL,ZESTRIL) 20 MG tablet. Thank you

## 2018-03-05 ENCOUNTER — Other Ambulatory Visit: Payer: Self-pay

## 2018-03-05 ENCOUNTER — Inpatient Hospital Stay: Payer: PPO | Attending: Oncology

## 2018-03-05 DIAGNOSIS — Z79899 Other long term (current) drug therapy: Secondary | ICD-10-CM | POA: Diagnosis not present

## 2018-03-05 DIAGNOSIS — N189 Chronic kidney disease, unspecified: Secondary | ICD-10-CM | POA: Diagnosis not present

## 2018-03-05 DIAGNOSIS — D696 Thrombocytopenia, unspecified: Secondary | ICD-10-CM | POA: Insufficient documentation

## 2018-03-05 DIAGNOSIS — I129 Hypertensive chronic kidney disease with stage 1 through stage 4 chronic kidney disease, or unspecified chronic kidney disease: Secondary | ICD-10-CM | POA: Insufficient documentation

## 2018-03-05 DIAGNOSIS — Z7984 Long term (current) use of oral hypoglycemic drugs: Secondary | ICD-10-CM | POA: Insufficient documentation

## 2018-03-05 DIAGNOSIS — D472 Monoclonal gammopathy: Secondary | ICD-10-CM | POA: Insufficient documentation

## 2018-03-05 DIAGNOSIS — Z7982 Long term (current) use of aspirin: Secondary | ICD-10-CM | POA: Diagnosis not present

## 2018-03-05 DIAGNOSIS — E1122 Type 2 diabetes mellitus with diabetic chronic kidney disease: Secondary | ICD-10-CM | POA: Insufficient documentation

## 2018-03-05 LAB — CBC WITH DIFFERENTIAL/PLATELET
Abs Immature Granulocytes: 0.02 10*3/uL (ref 0.00–0.07)
Basophils Absolute: 0 10*3/uL (ref 0.0–0.1)
Basophils Relative: 0 %
EOS ABS: 0.2 10*3/uL (ref 0.0–0.5)
EOS PCT: 3 %
HCT: 41.8 % (ref 39.0–52.0)
Hemoglobin: 13 g/dL (ref 13.0–17.0)
IMMATURE GRANULOCYTES: 0 %
Lymphocytes Relative: 41 %
Lymphs Abs: 2.3 10*3/uL (ref 0.7–4.0)
MCH: 26.5 pg (ref 26.0–34.0)
MCHC: 31.1 g/dL (ref 30.0–36.0)
MCV: 85.3 fL (ref 80.0–100.0)
Monocytes Absolute: 0.5 10*3/uL (ref 0.1–1.0)
Monocytes Relative: 9 %
NEUTROS PCT: 47 %
NRBC: 0 % (ref 0.0–0.2)
Neutro Abs: 2.6 10*3/uL (ref 1.7–7.7)
PLATELETS: 130 10*3/uL — AB (ref 150–400)
RBC: 4.9 MIL/uL (ref 4.22–5.81)
RDW: 13.2 % (ref 11.5–15.5)
WBC: 5.5 10*3/uL (ref 4.0–10.5)

## 2018-03-05 LAB — BASIC METABOLIC PANEL
ANION GAP: 7 (ref 5–15)
BUN: 15 mg/dL (ref 8–23)
CALCIUM: 9.6 mg/dL (ref 8.9–10.3)
CHLORIDE: 103 mmol/L (ref 98–111)
CO2: 29 mmol/L (ref 22–32)
CREATININE: 1.27 mg/dL — AB (ref 0.61–1.24)
GFR calc non Af Amer: 52 mL/min — ABNORMAL LOW (ref >60)
Glucose, Bld: 98 mg/dL (ref 70–99)
Potassium: 4.1 mmol/L (ref 3.5–5.1)
SODIUM: 139 mmol/L (ref 135–145)

## 2018-03-06 LAB — PROTEIN ELECTROPHORESIS, SERUM
A/G RATIO SPE: 1 (ref 0.7–1.7)
Albumin ELP: 3.6 g/dL (ref 2.9–4.4)
Alpha-1-Globulin: 0.2 g/dL (ref 0.0–0.4)
Alpha-2-Globulin: 0.8 g/dL (ref 0.4–1.0)
BETA GLOBULIN: 0.9 g/dL (ref 0.7–1.3)
Gamma Globulin: 1.9 g/dL — ABNORMAL HIGH (ref 0.4–1.8)
Globulin, Total: 3.7 g/dL (ref 2.2–3.9)
M-SPIKE, %: 0.9 g/dL — AB
Total Protein ELP: 7.3 g/dL (ref 6.0–8.5)

## 2018-03-06 LAB — IGG, IGA, IGM
IgA: 181 mg/dL (ref 61–437)
IgG (Immunoglobin G), Serum: 2038 mg/dL — ABNORMAL HIGH (ref 700–1600)
IgM (Immunoglobulin M), Srm: 64 mg/dL (ref 15–143)

## 2018-03-06 LAB — KAPPA/LAMBDA LIGHT CHAINS
KAPPA FREE LGHT CHN: 48.7 mg/L — AB (ref 3.3–19.4)
KAPPA, LAMDA LIGHT CHAIN RATIO: 2.69 — AB (ref 0.26–1.65)
LAMDA FREE LIGHT CHAINS: 18.1 mg/L (ref 5.7–26.3)

## 2018-03-07 LAB — PROTEIN ELECTRO, RANDOM URINE
ALPHA-1-GLOBULIN, U: 1.3 %
Albumin ELP, Urine: 65.6 %
Alpha-2-Globulin, U: 6.5 %
Beta Globulin, U: 12.6 %
GAMMA GLOBULIN, U: 14 %
Total Protein, Urine: 40 mg/dL

## 2018-03-09 NOTE — Progress Notes (Signed)
[place]  Telephone:(336) [phone] Fax:(336) [phone]  ID: Daniel Carney OB: 03-19-1941  MR#: [id number]  PGF#:[id number]  Patient Care Team: McLean-Scocuzza, Nino Glow, MD as PCP - General (Internal Medicine) Vidal Schwalbe Yvetta Coder, FNP as Nurse Practitioner (Family Medicine) Bary Castilla Forest Gleason, MD (General Surgery)  CHIEF COMPLAINT: Thrombocytopenia, MGUS.  INTERVAL HISTORY: Patient returns to clinic today for repeat laboratory can further evaluation.  He continues to feel well and remains asymptomatic.  He has no neurologic complaints.  He denies any recent fevers or illnesses.  He denies any bony pain.  He has a good appetite and denies weight loss.  He denies any easy bleeding or bruising.  He has no chest pain or shortness of breath.  He denies any nausea, vomiting, constipation, or diarrhea.  He has no urinary complaints.  Patient feels at his baseline offers no specific complaints today.  REVIEW OF SYSTEMS:   Review of Systems  Constitutional: Negative.  Negative for fever, malaise/fatigue and weight loss.  Respiratory: Negative for cough, hemoptysis and shortness of breath.   Cardiovascular: Negative.  Negative for chest pain and leg swelling.  Gastrointestinal: Negative.  Negative for abdominal pain, blood in stool and melena.  Genitourinary: Negative.  Negative for dysuria.  Musculoskeletal: Negative.  Negative for back pain.  Skin: Negative.  Negative for rash.  Neurological: Negative.  Negative for focal weakness, weakness and headaches.  Psychiatric/Behavioral: Negative.  The patient is not nervous/anxious.     As per HPI. Otherwise, a complete review of systems is negative.  PAST MEDICAL HISTORY: Past Medical History:  Diagnosis Date  . Cancer [facility])    prostate s/p removal 2004   . Diabetes mellitus without complication ([place])   . Hyperlipidemia   . Hypertension   . Type 2 diabetes mellitus with diabetic chronic kidney disease ([place]) 2007  . Yaws    childhood; +RPR 2/2 to this history in the past     PAST SURGICAL HISTORY: Past Surgical History:  Procedure Laterality Date  . PROSTATE SURGERY  2004    FAMILY HISTORY: Family History  Problem Relation Age of Onset  . Diabetes Mother   . Kidney disease Mother   . Hypertension Mother     ADVANCED DIRECTIVES (Y/N):  N  HEALTH MAINTENANCE: Social History   Tobacco Use  . Smoking status: Never Smoker  . Smokeless tobacco: Never Used  Substance Use Topics  . Alcohol use: No    Alcohol/week: 0.0 standard drinks  . Drug use: No     Colonoscopy:  PAP:  Bone density:  Lipid panel:  No Known Allergies  Current Outpatient Medications  Medication Sig Dispense Refill  . aspirin EC 81 MG tablet Take 81 mg by mouth daily.    Marland Kitchen glipiZIDE (GLUCOTROL) 5 MG tablet Take 1 tablet (5 mg total) by mouth daily before breakfast. 90 tablet 1  . lisinopril (PRINIVIL,ZESTRIL) 20 MG tablet Take 1 tablet (20 mg total) by mouth daily. 90 tablet 1  . metFORMIN (GLUCOPHAGE) 500 MG tablet TAKE 1 TABLET (500 MG TOTAL) BY MOUTH 2 (TWO) TIMES DAILY WITH A MEAL. 180 tablet 3  . Omega-3 Fatty Acids (FISH OIL) 1000 MG CAPS Take 1 capsule by mouth daily.    . Lancets MISC Please dispense One Touch Lancets. Check blood glucose once daily. (Patient not taking: Reported on 03/12/2018) 100 each 11  . ONE TOUCH ULTRA TEST test strip USE UP TO 4 TIMES DAILY AS DIRECTED (Patient not taking: Reported on 03/12/2018) 100 each 3  No current facility-administered medications for this visit.     OBJECTIVE: Vitals:   03/12/18 1346  BP: (!) 167/80  Pulse: 67  Resp: 18  Temp: (!) 95.2 F (35.1 C)     Body mass index is 26.74 kg/m.    ECOG FS:0 - Asymptomatic  General: Well-developed, well-nourished, no acute distress. Eyes: Pink conjunctiva, anicteric sclera. HEENT: Normocephalic, moist mucous membranes. Lungs: Clear to auscultation bilaterally. Heart: Regular rate and rhythm. No rubs, murmurs, or  gallops. Abdomen: Soft, nontender, nondistended. No organomegaly noted, normoactive bowel sounds. Musculoskeletal: No edema, cyanosis, or clubbing. Neuro: Alert, answering all questions appropriately. Cranial nerves grossly intact. Skin: No rashes or petechiae noted. Psych: Normal affect.  LAB RESULTS:  Lab Results  Component Value Date   NA 139 03/05/2018   K 4.1 03/05/2018   CL 103 03/05/2018   CO2 29 03/05/2018   GLUCOSE 98 03/05/2018   BUN 15 03/05/2018   CREATININE 1.27 (H) 03/05/2018   CALCIUM 9.6 03/05/2018   PROT 7.7 05/11/2017   ALBUMIN 4.2 05/11/2017   AST 18 05/11/2017   ALT 11 05/11/2017   ALKPHOS 78 05/11/2017   BILITOT 0.5 05/11/2017   GFRNONAA 52 (L) 03/05/2018   GFRAA >60 03/05/2018    Lab Results  Component Value Date   WBC 5.5 03/05/2018   NEUTROABS 2.6 03/05/2018   HGB 13.0 03/05/2018   HCT 41.8 03/05/2018   MCV 85.3 03/05/2018   PLT 130 (L) 03/05/2018   Lab Results  Component Value Date   TOTALPROTELP 7.3 03/05/2018   ALBUMINELP 3.6 03/05/2018   A1GS 0.2 03/05/2018   A2GS 0.8 03/05/2018   BETS 0.9 03/05/2018   GAMS 1.9 (H) 03/05/2018   MSPIKE 0.9 (H) 03/05/2018   SPEI Comment 03/05/2018     STUDIES: No results found.  ASSESSMENT: Thrombocytopenia, MGUS.  PLAN:  1. Thrombocytopenia: Patient's platelet count remains decreased at 130, but essentially unchanged. Previously other than the mildly increased M spike on SPEP, all of his other laboratory work was either negative or within normal limits.  No intervention is needed at this time.  Patient does not require bone marrow biopsy.  Return to clinic in 6 months with repeat laboratory for further evaluation.   2.  MGUS: Patient's M spike on SPEP is 0.9 which is essentially unchanged since February 2019.  He has an elevated IgG component of 2038, his IgM and IgA within normal limits.  He has a mildly increased kappa/lambda light ratio of 2.69.  He has no evidence of endorgan damage other than  the mild thrombocytopenia as stated above.  No intervention is needed at this time.  Patient does not require bone marrow biopsy or metastatic bone survey, but would consider if there is suspicion of progression of disease.  Return to clinic in 6 months with repeat laboratory can further evaluation.  I spent a total of 20 minutes face-to-face with the patient of which greater than 50% of the visit was spent in counseling and coordination of care as detailed above.   Patient expressed understanding and was in agreement with this plan. He also understands that He can call clinic at any time with any questions, concerns, or complaints.    Lloyd Huger, MD   03/14/2018 10:04 AM

## 2018-03-12 ENCOUNTER — Other Ambulatory Visit: Payer: Self-pay

## 2018-03-12 ENCOUNTER — Inpatient Hospital Stay (HOSPITAL_BASED_OUTPATIENT_CLINIC_OR_DEPARTMENT_OTHER): Payer: PPO | Admitting: Oncology

## 2018-03-12 VITALS — BP 167/80 | HR 67 | Temp 95.2°F | Resp 18 | Wt 170.7 lb

## 2018-03-12 DIAGNOSIS — D696 Thrombocytopenia, unspecified: Secondary | ICD-10-CM | POA: Diagnosis not present

## 2018-03-12 DIAGNOSIS — I129 Hypertensive chronic kidney disease with stage 1 through stage 4 chronic kidney disease, or unspecified chronic kidney disease: Secondary | ICD-10-CM | POA: Diagnosis not present

## 2018-03-12 DIAGNOSIS — E1122 Type 2 diabetes mellitus with diabetic chronic kidney disease: Secondary | ICD-10-CM

## 2018-03-12 DIAGNOSIS — D472 Monoclonal gammopathy: Secondary | ICD-10-CM | POA: Diagnosis not present

## 2018-03-12 DIAGNOSIS — N189 Chronic kidney disease, unspecified: Secondary | ICD-10-CM

## 2018-03-12 NOTE — Progress Notes (Signed)
Here for follow up. " for my age I feel very good "  No voiced c/o

## 2018-04-23 ENCOUNTER — Other Ambulatory Visit: Payer: Self-pay | Admitting: Internal Medicine

## 2018-04-23 DIAGNOSIS — I10 Essential (primary) hypertension: Secondary | ICD-10-CM

## 2018-04-23 MED ORDER — LISINOPRIL 20 MG PO TABS: 20.0000 mg | ORAL_TABLET | Freq: Every day | ORAL | 0 refills | Status: DC

## 2018-04-23 NOTE — Telephone Encounter (Signed)
Patient called and advised he has a refill at the pharmacy. He says the pharmacy says he has no refills left. I asked if he wanted to schedule his physical, he agreed and asked for a morning on either Wednesday or Thursday. First available is Wednesday, 05/29/18 at 0900 with Dr. Terese Door.

## 2018-04-23 NOTE — Telephone Encounter (Signed)
Copied from [place] [id number]. Topic: Quick Communication - Rx Refill/Question >> Apr 23, 2018 12:42 PM Percell Belt A wrote: Medication: lisinopril (PRINIVIL,ZESTRIL) 20 MG tablet [[id number]]  Pt is completely out.     Has the patient contacted their pharmacy? Yes , he stated he called it into pharmacy last Friday  (Agent: If no, request that the patient contact the pharmacy for the refill.) (Agent: If yes, when and what did the pharmacy advise?)  Preferred Pharmacy (with phone number or street name): [id number] - [place], [address] [phone] (Phone)   Agent: Please be advised that RX refills may take up to 3 business days. We ask that you follow-up with your pharmacy.

## 2018-05-13 ENCOUNTER — Ambulatory Visit (INDEPENDENT_AMBULATORY_CARE_PROVIDER_SITE_OTHER): Payer: PPO

## 2018-05-13 VITALS — BP 142/78 | HR 71 | Temp 98.2°F | Resp 15 | Ht 67.0 in | Wt 170.8 lb

## 2018-05-13 DIAGNOSIS — Z0000 Encounter for general adult medical examination without abnormal findings: Secondary | ICD-10-CM

## 2018-05-13 NOTE — Progress Notes (Signed)
Subjective:   Daniel Carney is a 78 y.o. male who presents for Medicare Annual/Subsequent preventive examination.  Review of Systems:  No ROS.  Medicare Wellness Visit. Additional risk factors are reflected in the social history. Cardiac Risk Factors include: advanced age (>108men, >52 women);hypertension;diabetes mellitus;male gender     Objective:    Vitals: BP (!) 142/78 (BP Location: Left Arm, Patient Position: Sitting, Cuff Size: Normal)   Pulse 71   Temp 98.2 F (36.8 C) (Oral)   Resp 15   Ht 5\' 7"  (1.702 m)   Wt 170 lb 12.8 oz (77.5 kg)   SpO2 98%   BMI 26.75 kg/m   Body mass index is 26.75 kg/m.  Advanced Directives 05/13/2018 03/12/2018 09/04/2017 06/04/2017 05/11/2017 07/13/2015 12/22/2014  Does Patient Have a Medical Advance Directive? No No No No No No No  Would patient like information on creating a medical advance directive? No - Patient declined Yes (MAU/Ambulatory/Procedural Areas - Information given) Yes (MAU/Ambulatory/Procedural Areas - Information given) - Yes (MAU/Ambulatory/Procedural Areas - Information given) No - patient declined information No - patient declined information    Tobacco Social History   Tobacco Use  Smoking Status Never Smoker  Smokeless Tobacco Never Used     Counseling given: Not Answered   Clinical Intake:  Pre-visit preparation completed: Yes        Diabetes: Yes(Followed by pcp)  How often do you need to have someone help you when you read instructions, pamphlets, or other written materials from your doctor or pharmacy?: 1 - Never  Interpreter Needed?: No     Past Medical History:  Diagnosis Date  . Cancer [facility])    prostate s/p removal 2004   . Diabetes mellitus without complication ([place])   . Hyperlipidemia   . Hypertension   . Type 2 diabetes mellitus with diabetic chronic kidney disease ([place]) 2007  . Yaws    childhood; +RPR 2/2 to this history in the past    Past Surgical History:  Procedure Laterality Date   . PROSTATE SURGERY  2004   Family History  Problem Relation Age of Onset  . Diabetes Mother   . Kidney disease Mother   . Hypertension Mother    Social History   Socioeconomic History  . Marital status: Married    Spouse name: Not on file  . Number of children: Not on file  . Years of education: Not on file  . Highest education level: Not on file  Occupational History  . Not on file  Social Needs  . Financial resource strain: Not hard at all  . Food insecurity:    Worry: Never true    Inability: Never true  . Transportation needs:    Medical: No    Non-medical: No  Tobacco Use  . Smoking status: Never Smoker  . Smokeless tobacco: Never Used  Substance and Sexual Activity  . Alcohol use: No    Alcohol/week: 0.0 standard drinks  . Drug use: No  . Sexual activity: Not Currently    Partners: Female  Lifestyle  . Physical activity:    Days per week: Not on file    Minutes per session: Not on file  . Stress: Not on file  Relationships  . Social connections:    Talks on phone: Not on file    Gets together: Not on file    Attends religious service: Not on file    Active member of club or organization: Not on file    Attends meetings  of clubs or organizations: Not on file    Relationship status: Not on file  Other Topics Concern  . Not on file  Social History Narrative   From [place]     Outpatient Encounter Medications as of 05/13/2018  Medication Sig  . aspirin EC 81 MG tablet Take 81 mg by mouth daily.  Marland Kitchen glipiZIDE (GLUCOTROL) 5 MG tablet Take 1 tablet (5 mg total) by mouth daily before breakfast.  . Lancets MISC Please dispense One Touch Lancets. Check blood glucose once daily.  Marland Kitchen lisinopril (PRINIVIL,ZESTRIL) 20 MG tablet Take 1 tablet (20 mg total) by mouth daily.  . metFORMIN (GLUCOPHAGE) 500 MG tablet TAKE 1 TABLET (500 MG TOTAL) BY MOUTH 2 (TWO) TIMES DAILY WITH A MEAL.  Marland Kitchen Omega-3 Fatty Acids (FISH OIL) 1000 MG CAPS Take 1 capsule by mouth daily.  .  ONE TOUCH ULTRA TEST test strip USE UP TO 4 TIMES DAILY AS DIRECTED   No facility-administered encounter medications on file as of 05/13/2018.     Activities of Daily Living In your present state of health, do you have any difficulty performing the following activities: 05/13/2018  Hearing? N  Vision? N  Difficulty concentrating or making decisions? N  Walking or climbing stairs? N  Dressing or bathing? N  Doing errands, shopping? N  Preparing Food and eating ? N  Using the Toilet? N  In the past six months, have you accidently leaked urine? Y  Comment Managed with daily pad  Do you have problems with loss of bowel control? N  Managing your Medications? N  Managing your Finances? N  Housekeeping or managing your Housekeeping? N  Some recent data might be hidden    Patient Care Team: McLean-Scocuzza, Nino Glow, MD as PCP - General (Internal Medicine) Vidal Schwalbe, Yvetta Coder, FNP as Nurse Practitioner (Family Medicine) Bary Castilla Forest Gleason, MD (General Surgery)   Assessment:   This is a routine wellness examination for Daniel Carney.  Notes annual check up due with urology and nephrology for baseline. Sent to pcp for follow up at upcoming appointment 05/29/18.  Diabetes- monitors his blood sugar daily; averaging 110.   Health Screenings  PSA -05/11/17 (0.00) B12- 06/04/17 (262) Glaucoma -none Hearing -passes the whisper test Hemoglobin A1C - 05/11/17 (6.8) Cholesterol -05/11/17 (175) Dental-every 12 months Vision-every 12 months  Social  Alcohol intake -no Smoking history- none Smokers in home? none Illicit drug use? none Exercise -treadmill, elliptical 5 days weekly, 1 hour Diet -low carb Sexually Active -not currently   Safety  Patient feels safe at home.  Patient does have smoke detectors at home  Patient does wear sunscreen or protective clothing when in direct sunlight  Patient does wear seat belt when driving or riding with others.   Activities of Daily Living Patient can  do their own household chores. Denies needing assistance with: driving, feeding themselves, getting from bed to chair, getting to the toilet, bathing/showering, dressing, managing money, climbing flight of stairs, or preparing meals.   Depression Screen Patient denies losing interest in daily life, feeling hopeless, or crying easily over simple problems.   Fall Screen Patient denies being afraid of falling or falling in the last year.   Memory Screen Patient denies problems with memory, misplacing items, and is able to balance checkbook/bank accounts.  Patient is alert, normal appearance, oriented to person/place/and time. Correctly identified the president of the [place], recall of 3/3 objects, and performing simple calculations.  Patient displays appropriate judgement and can read correct time from  watch face.   Immunizations The following Immunizations have been discussed: Influenza, shingles, pneumonia, and tetanus.   Other Providers Patient Care Team: McLean-Scocuzza, Nino Glow, MD as PCP - General (Internal Medicine) Vidal Schwalbe Yvetta Coder, FNP as Nurse Practitioner (Family Medicine) Robert Bellow, MD (General Surgery)  Exercise Activities and Dietary recommendations Current Exercise Habits: Home exercise routine, Type of exercise: treadmill;calisthenics(Elliptical), Time (Minutes): 60, Frequency (Times/Week): 5, Weekly Exercise (Minutes/Week): 300, Intensity: Moderate  Goals    . Maintain Healthy Lifestyle     Monitor blood pressure Monitor blood sugar       Fall Risk Fall Risk  05/13/2018 05/24/2017 05/11/2017 02/11/2016 07/21/2015  Falls in the past year? 0 No No No Yes  Number falls in past yr: - - - - 1  Injury with Fall? - - - - Yes  Comment - - - - L buttock laceration  Follow up - - - - Falls prevention discussed   Depression Screen PHQ 2/9 Scores 05/13/2018 05/24/2017 05/11/2017 02/11/2016  PHQ - 2 Score 0 0 0 0  PHQ- 9 Score - - 0 -    Cognitive Function MMSE -  Mini Mental State Exam 05/11/2017  Orientation to time 5  Orientation to Place 5  Registration 3  Attention/ Calculation 5  Recall 2  Language- name 2 objects 2  Language- repeat 1  Language- follow 3 step command 3  Language- read & follow direction 1  Write a sentence 1  Copy design 1  Total score 29     6CIT Screen 05/13/2018  What Year? 0 points  What month? 0 points  What time? 0 points  Count back from 20 0 points  Months in reverse 0 points  Repeat phrase 0 points  Total Score 0    Immunization History  Administered Date(s) Administered  . Influenza, High Dose Seasonal PF 02/11/2016  . Tdap 07/13/2015   Screening Tests Health Maintenance  Topic Date Due  . FOOT EXAM  02/10/2017  . HEMOGLOBIN A1C  11/08/2017  . INFLUENZA VACCINE  07/23/2018 (Originally 11/22/2017)  . PNA vac Low Risk Adult (2 of 2 - PPSV23) 05/14/2019 (Originally 04/24/2014)  . OPHTHALMOLOGY EXAM  02/06/2019  . TETANUS/TDAP  07/12/2025       Plan:   End of life planning; Advanced aging; Advanced directives discussed.  No HCPOA/Living Will.  Additional information declined at this time.  I have personally reviewed and noted the following in the patient's chart:   . Medical and social history . Use of alcohol, tobacco or illicit drugs  . Current medications and supplements . Functional ability and status . Nutritional status . Physical activity . Advanced directives . List of other physicians . Hospitalizations, surgeries, and ER visits in previous 12 months . Vitals . Screenings to include cognitive, depression, and falls . Referrals and appointments  In addition, I have reviewed and discussed with patient certain preventive protocols, quality metrics, and best practice recommendations. A written personalized care plan for preventive services as well as general preventive health recommendations were provided to patient.     Varney Biles, LPN  0/10/1217

## 2018-05-13 NOTE — Progress Notes (Signed)
Reviewed LPN note. No acute issues, will follow with PCP

## 2018-05-13 NOTE — Patient Instructions (Addendum)
  Daniel Carney , Thank you for taking time to come for your Medicare Wellness Visit. I appreciate your ongoing commitment to your health goals. Please review the following plan we discussed and let me know if I can assist you in the future.   These are the goals we discussed: Goals    . Maintain Healthy Lifestyle     Monitor blood pressure Monitor blood sugar       This is a list of the screening recommended for you and due dates:  Health Maintenance  Topic Date Due  . Complete foot exam   02/10/2017  . Hemoglobin A1C  11/08/2017  . Flu Shot  07/23/2018*  . Pneumonia vaccines (2 of 2 - PPSV23) 05/14/2019*  . Eye exam for diabetics  02/06/2019  . Tetanus Vaccine  07/12/2025  *Topic was postponed. The date shown is not the original due date.

## 2018-05-29 ENCOUNTER — Encounter: Payer: PPO | Admitting: Internal Medicine

## 2018-06-03 ENCOUNTER — Telehealth: Payer: Self-pay | Admitting: Internal Medicine

## 2018-06-03 ENCOUNTER — Other Ambulatory Visit: Payer: Self-pay

## 2018-06-03 DIAGNOSIS — E1122 Type 2 diabetes mellitus with diabetic chronic kidney disease: Secondary | ICD-10-CM

## 2018-06-03 MED ORDER — GLIPIZIDE 5 MG PO TABS: 5.0000 mg | ORAL_TABLET | Freq: Every day | ORAL | 0 refills | Status: DC

## 2018-06-03 NOTE — Telephone Encounter (Signed)
Pt needs a refill on his glipiZIDE (GLUCOTROL) 5 MG tablet. He has an appt tomorrow morning. Only have one pill for today.

## 2018-06-04 ENCOUNTER — Encounter: Payer: Self-pay | Admitting: Internal Medicine

## 2018-06-04 ENCOUNTER — Ambulatory Visit (INDEPENDENT_AMBULATORY_CARE_PROVIDER_SITE_OTHER): Payer: PPO | Admitting: Internal Medicine

## 2018-06-04 VITALS — BP 144/76 | HR 62 | Temp 97.9°F | Ht 67.0 in | Wt 169.8 lb

## 2018-06-04 DIAGNOSIS — R490 Dysphonia: Secondary | ICD-10-CM | POA: Diagnosis not present

## 2018-06-04 DIAGNOSIS — Z8546 Personal history of malignant neoplasm of prostate: Secondary | ICD-10-CM

## 2018-06-04 DIAGNOSIS — Z1329 Encounter for screening for other suspected endocrine disorder: Secondary | ICD-10-CM

## 2018-06-04 DIAGNOSIS — R7989 Other specified abnormal findings of blood chemistry: Secondary | ICD-10-CM | POA: Diagnosis not present

## 2018-06-04 DIAGNOSIS — E1122 Type 2 diabetes mellitus with diabetic chronic kidney disease: Secondary | ICD-10-CM | POA: Diagnosis not present

## 2018-06-04 DIAGNOSIS — E559 Vitamin D deficiency, unspecified: Secondary | ICD-10-CM | POA: Diagnosis not present

## 2018-06-04 DIAGNOSIS — I10 Essential (primary) hypertension: Secondary | ICD-10-CM | POA: Diagnosis not present

## 2018-06-04 DIAGNOSIS — Z841 Family history of disorders of kidney and ureter: Secondary | ICD-10-CM | POA: Diagnosis not present

## 2018-06-04 LAB — COMPREHENSIVE METABOLIC PANEL
ALT: 14 U/L (ref 0–53)
AST: 20 U/L (ref 0–37)
Albumin: 4.1 g/dL (ref 3.5–5.2)
Alkaline Phosphatase: 82 U/L (ref 39–117)
BILIRUBIN TOTAL: 0.6 mg/dL (ref 0.2–1.2)
BUN: 17 mg/dL (ref 6–23)
CALCIUM: 9.2 mg/dL (ref 8.4–10.5)
CHLORIDE: 106 meq/L (ref 96–112)
CO2: 29 mEq/L (ref 19–32)
Creatinine, Ser: 1.16 mg/dL (ref 0.40–1.50)
GFR: 73.61 mL/min (ref >60.00)
GLUCOSE: 98 mg/dL (ref 70–99)
POTASSIUM: 4.1 meq/L (ref 3.5–5.1)
Sodium: 141 mEq/L (ref 135–145)
Total Protein: 7.4 g/dL (ref 6.0–8.3)

## 2018-06-04 LAB — VITAMIN D 25 HYDROXY (VIT D DEFICIENCY, FRACTURES): VITD: 71.35 ng/mL (ref 30.00–100.00)

## 2018-06-04 LAB — LIPID PANEL
Cholesterol: 167 mg/dL (ref 0–200)
HDL: 34.7 mg/dL — AB (ref >39.00)
LDL Cholesterol: 113 mg/dL — ABNORMAL HIGH (ref 0–99)
NonHDL: 132.52
TRIGLYCERIDES: 100 mg/dL (ref 0.0–149.0)
Total CHOL/HDL Ratio: 5
VLDL: 20 mg/dL (ref 0.0–40.0)

## 2018-06-04 LAB — PSA, MEDICARE: PSA: 0.01 ng/ml — ABNORMAL LOW (ref 0.10–4.00)

## 2018-06-04 LAB — TSH: TSH: 0.77 u[IU]/mL (ref 0.35–4.50)

## 2018-06-04 LAB — HEMOGLOBIN A1C: Hgb A1c MFr Bld: 6.5 % (ref 4.6–6.5)

## 2018-06-04 MED ORDER — LISINOPRIL 20 MG PO TABS: 20.0000 mg | ORAL_TABLET | Freq: Every day | ORAL | 3 refills | Status: AC

## 2018-06-04 MED ORDER — GLIPIZIDE 5 MG PO TABS: 5.0000 mg | ORAL_TABLET | Freq: Every day | ORAL | 3 refills | Status: AC

## 2018-06-04 MED ORDER — METFORMIN HCL 500 MG PO TABS: 500.0000 mg | ORAL_TABLET | Freq: Two times a day (BID) | ORAL | 3 refills | Status: AC

## 2018-06-04 NOTE — Progress Notes (Addendum)
Chief Complaint  Patient presents with  . Annual Exam   F/u  1. HTN overall controlled at home 97-104/70s pt did not take lisinopril 20 mg qd due to fasting  2. DM 2 A1C 6.8 on glipizide 5 mg qd and glucophage 500 mg qd eye exam next month  3. C/o hoarseness in voice x 6 months  4. FH ESRD wants to see nephrologist  5. H/o prostate cancer want to see urology has not seen in a while    Review of Systems  Constitutional: Positive for weight loss.       Wt loss trying    HENT: Negative for hearing loss.        +hoarse voice    Eyes: Negative for blurred vision.  Respiratory: Negative for shortness of breath.   Cardiovascular: Negative for chest pain.  Gastrointestinal: Negative for abdominal pain.  Musculoskeletal: Negative for falls.  Skin: Negative for rash.  Neurological: Negative for headaches.  Psychiatric/Behavioral: Negative for depression.   Past Medical History:  Diagnosis Date  . Cancer [facility])    prostate s/p removal 2004   . Diabetes mellitus without complication ([place])   . Hyperlipidemia   . Hypertension   . Type 2 diabetes mellitus with diabetic chronic kidney disease ([place]) 2007  . Yaws    childhood; +RPR 2/2 to this history in the past    Past Surgical History:  Procedure Laterality Date  . PROSTATE SURGERY  2004   Family History  Problem Relation Age of Onset  . Diabetes Mother   . Kidney disease Mother   . Hypertension Mother    Social History   Socioeconomic History  . Marital status: Married    Spouse name: Not on file  . Number of children: Not on file  . Years of education: Not on file  . Highest education level: Not on file  Occupational History  . Not on file  Social Needs  . Financial resource strain: Not hard at all  . Food insecurity:    Worry: Never true    Inability: Never true  . Transportation needs:    Medical: No    Non-medical: No  Tobacco Use  . Smoking status: Never Smoker  . Smokeless tobacco: Never Used  Substance  and Sexual Activity  . Alcohol use: No    Alcohol/week: 0.0 standard drinks  . Drug use: No  . Sexual activity: Not Currently    Partners: Female  Lifestyle  . Physical activity:    Days per week: Not on file    Minutes per session: Not on file  . Stress: Not on file  Relationships  . Social connections:    Talks on phone: Not on file    Gets together: Not on file    Attends religious service: Not on file    Active member of club or organization: Not on file    Attends meetings of clubs or organizations: Not on file    Relationship status: Not on file  . Intimate partner violence:    Fear of current or ex partner: Not on file    Emotionally abused: Not on file    Physically abused: Not on file    Forced sexual activity: Not on file  Other Topics Concern  . Not on file  Social History Narrative   From [place]    Current Meds  Medication Sig  . aspirin EC 81 MG tablet Take 81 mg by mouth daily.  Marland Kitchen glipiZIDE (GLUCOTROL) 5 MG  tablet Take 1 tablet (5 mg total) by mouth daily before breakfast.  . Lancets MISC Please dispense One Touch Lancets. Check blood glucose once daily.  Marland Kitchen lisinopril (PRINIVIL,ZESTRIL) 20 MG tablet Take 1 tablet (20 mg total) by mouth daily.  . metFORMIN (GLUCOPHAGE) 500 MG tablet Take 1 tablet (500 mg total) by mouth 2 (two) times daily with a meal.  . Omega-3 Fatty Acids (FISH OIL) 1000 MG CAPS Take 1 capsule by mouth daily.  . ONE TOUCH ULTRA TEST test strip USE UP TO 4 TIMES DAILY AS DIRECTED  . [DISCONTINUED] glipiZIDE (GLUCOTROL) 5 MG tablet Take 1 tablet (5 mg total) by mouth daily before breakfast.  . [DISCONTINUED] lisinopril (PRINIVIL,ZESTRIL) 20 MG tablet Take 1 tablet (20 mg total) by mouth daily.  . [DISCONTINUED] metFORMIN (GLUCOPHAGE) 500 MG tablet TAKE 1 TABLET (500 MG TOTAL) BY MOUTH 2 (TWO) TIMES DAILY WITH A MEAL.   No Known Allergies No results found for this or any previous visit (from the past 2160 hour(s)). Objective  Body mass  index is 26.59 kg/m. Wt Readings from Last 3 Encounters:  06/04/18 169 lb 12.8 oz (77 kg)  05/13/18 170 lb 12.8 oz (77.5 kg)  03/12/18 170 lb 11.2 oz (77.4 kg)   Temp Readings from Last 3 Encounters:  06/04/18 97.9 F (36.6 C) (Oral)  05/13/18 98.2 F (36.8 C) (Oral)  03/12/18 (!) 95.2 F (35.1 C) (Tympanic)   BP Readings from Last 3 Encounters:  06/04/18 (!) 144/76  05/13/18 (!) 142/78  03/12/18 (!) 167/80   Pulse Readings from Last 3 Encounters:  06/04/18 62  05/13/18 71  03/12/18 67    Physical Exam Vitals signs and nursing note reviewed.  Constitutional:      Appearance: Normal appearance. He is well-developed and well-groomed.  HENT:     Head: Normocephalic and atraumatic.     Nose: Nose normal.     Mouth/Throat:     Mouth: Mucous membranes are moist.     Pharynx: Oropharynx is clear.  Eyes:     Conjunctiva/sclera: Conjunctivae normal.     Pupils: Pupils are equal, round, and reactive to light.  Cardiovascular:     Rate and Rhythm: Normal rate and regular rhythm.     Heart sounds: Normal heart sounds.  Pulmonary:     Effort: Pulmonary effort is normal.     Breath sounds: Normal breath sounds.  Skin:    General: Skin is warm and dry.  Neurological:     General: No focal deficit present.     Mental Status: He is alert and oriented to person, place, and time. Mental status is at baseline.     Gait: Gait normal.  Psychiatric:        Attention and Perception: Attention and perception normal.        Mood and Affect: Mood and affect normal.        Speech: Speech normal.        Behavior: Behavior normal. Behavior is cooperative.        Thought Content: Thought content normal.        Cognition and Memory: Cognition and memory normal.        Judgment: Judgment normal.     Assessment   1. HTN 2. DM 2  3. Hoarseness  4. CKD 1/2 borderline, FH ESRD, h/o elevated Cr 1.27  5. H/o prostate cancer  6. HM Plan   1. Refilled meds  2. Refilled meds  Eye exam  06/2018 [place]  Foot exam at f/u  Declines statin for now  Fasting labs today  3. Referred to Dr. Tami Ribas or [place] to w/u  4   pt requests referral renal [facility] in [place]  5. Referred [facility]  6.  Declines flu shot  Had Tdap  He is unsure which vaccine he has had 2-3 years ago pna 23 or prevnar and may have had at another Mds office declines vaccine for now  Consider shingrix in future  Hep B immune likely 2/2 natural infection core total +  PSA with h/o prostate cancer 0.00 Sent cologuard today    [facility] seen by Brett Fairy PAC + LPR rec ppi bid f/u in 1 month hoarse voice f/u in 1 month flex larygoscopy today on 06/20/18   Provider: Dr. Olivia Mackie McLean-Scocuzza-Internal Medicine

## 2018-06-04 NOTE — Patient Instructions (Addendum)
Daniel Carney [place]  [phone]   2. Dr. Tami Ribas  [place] [phone]  3. [facility] [place]  Hoarseness   Hoarseness, also called dysphonia, is any abnormal change in your voice that can make it difficult to speak. Your voice may sound raspy, breathy, or strained. Hoarseness is caused by a problem with your vocal cords (vocal folds). These are two bands of tissue inside your voice box (larynx). When you speak, your vocal cords move back and forth to create sound. The surfaces of your vocal cords need to be smooth for your voice to sound clear. Swelling or lumps on your vocal cords can cause hoarseness. Common causes of vocal cord problems include:  Infection in the nose, throat, and upper air passages (upper respiratory infection).  A long-term cough.  Straining or overusing your voice.  Smoking, or exposure to secondhand smoke.  Allergies.  Medication side effects.  Vocal cord growths.  Vocal cord injuries.  Stomach acids that move up in your throat and irritate your vocal cords (gastroesophageal reflux).  Diseases that affect the nervous system, such as a stroke or Parkinson's disease. Follow these instructions at home: Watch your condition for any changes. To ease discomfort and protect your vocal cords:  Rest your voice.  Do not whisper. Whispering can cause muscle strain.  Do not speak in a loud or harsh voice.  Avoid coughing or clearing your throat.  Do not use any products that contain nicotine or tobacco, such as cigarettes and e-cigarettes. If you need help quitting, ask your health care provider.  Avoid secondhand smoke.  Do not eat foods that give you heartburn, such as spicy or acidic foods like hot peppers and orange juice. Heartburn can make gastroesophageal reflux worse.  Do not drink beverages that contain caffeine (coffee, tea, or soft drinks) or alcohol (beer, wine,  or liquor).  Drink enough fluid to keep your urine pale yellow.  Use a humidifier if the air in your home is dry. If recommended by your health care provider, schedule an appointment with a speech-language specialist. This specialist may give you methods to try that can help you avoid misusing your voice. Contact a health care provider if:  You have hoarseness that lasts longer than 3 weeks.  You almost lose or completely lose your voice for more than 3 days.  You have pain when you swallow or try to talk.  You feel a lump in your neck. Get help right away if:  You have trouble swallowing.  You feel like you are choking when you swallow.  You cough up blood or vomit blood.  You have trouble breathing.  You choke, cannot swallow, or cannot breathe if you lie flat.  You notice swelling or a rash on your body, face, or tongue. Summary  Hoarseness, also called dysphonia, is any abnormal change in your voice that can make it difficult to speak. Your voice may sound raspy, breathy, or strained.  Hoarseness is caused by a problem with your vocal cords (vocal folds).  Do not speak in a loud or harsh voice, use nicotine or tobacco products, or eat foods that give you heartburn.  If recommended by your health care provider, meet with a speech-language specialist. This information is not intended to replace advice given to you by your health care provider. Make sure you discuss any questions you have with your health care provider. Document Released: 03/24/2005  Document Revised: 01/05/2017 Document Reviewed: 01/05/2017 Elsevier Interactive Patient Education  [employer].

## 2018-06-05 ENCOUNTER — Other Ambulatory Visit: Payer: Self-pay | Admitting: Internal Medicine

## 2018-06-05 ENCOUNTER — Telehealth: Payer: Self-pay | Admitting: Internal Medicine

## 2018-06-05 DIAGNOSIS — E785 Hyperlipidemia, unspecified: Secondary | ICD-10-CM

## 2018-06-05 LAB — URINALYSIS, ROUTINE W REFLEX MICROSCOPIC
Bilirubin Urine: NEGATIVE
Glucose, UA: NEGATIVE
HGB URINE DIPSTICK: NEGATIVE
HYALINE CAST: NONE SEEN /LPF
KETONES UR: NEGATIVE
Nitrite: NEGATIVE
RBC / HPF: NONE SEEN /HPF (ref 0–2)
SQUAMOUS EPITHELIAL / LPF: NONE SEEN /HPF (ref <=5)
Specific Gravity, Urine: 1.017 (ref 1.001–1.03)
pH: 5.5 (ref 5.0–8.0)

## 2018-06-05 LAB — MICROALBUMIN / CREATININE URINE RATIO
Creatinine, Urine: 125 mg/dL (ref 20–320)
Microalb Creat Ratio: 70 mcg/mg creat — ABNORMAL HIGH (ref <30)
Microalb, Ur: 8.7 mg/dL

## 2018-06-05 MED ORDER — ATORVASTATIN CALCIUM 10 MG PO TABS: 10.0000 mg | ORAL_TABLET | Freq: Every day | ORAL | 3 refills | Status: AC

## 2018-06-05 NOTE — Telephone Encounter (Signed)
06/05/2018 1:59pm  Pt requesting copy of lab results be mailed to his home address. Verified address: [address]  [place] [address]     Copied from [place] [phone]. Topic: Quick Communication - Lab Results (Clinic Use ONLY) >> Jun 05, 2018  1:08 PM Babs Bertin, CMA wrote: Called patient to inform them of [id number] lab results. When patient returns call, triage nurse may disclose results.

## 2018-06-10 DIAGNOSIS — Z1212 Encounter for screening for malignant neoplasm of rectum: Secondary | ICD-10-CM | POA: Diagnosis not present

## 2018-06-10 DIAGNOSIS — Z1211 Encounter for screening for malignant neoplasm of colon: Secondary | ICD-10-CM | POA: Diagnosis not present

## 2018-06-10 LAB — COLOGUARD: Cologuard: NEGATIVE

## 2018-06-13 ENCOUNTER — Telehealth: Payer: Self-pay | Admitting: Internal Medicine

## 2018-06-13 ENCOUNTER — Encounter: Payer: Self-pay | Admitting: Internal Medicine

## 2018-06-13 NOTE — Telephone Encounter (Signed)
cologuard negative repeat in 3 years   [place]

## 2018-06-17 NOTE — Telephone Encounter (Signed)
Left message for patient to return call back. PEC may give results.  

## 2018-06-20 DIAGNOSIS — K219 Gastro-esophageal reflux disease without esophagitis: Secondary | ICD-10-CM | POA: Diagnosis not present

## 2018-06-20 DIAGNOSIS — R490 Dysphonia: Secondary | ICD-10-CM | POA: Diagnosis not present

## 2018-06-20 NOTE — Telephone Encounter (Signed)
Pt given results per notes of Dr Terese Door on 06/13/18.Unable to document in result note due to result note not being routed to [facility].

## 2018-08-19 ENCOUNTER — Telehealth: Payer: Self-pay | Admitting: Internal Medicine

## 2018-08-19 MED ORDER — GLUCOSE BLOOD VI STRP: ORAL_STRIP | 3 refills | Status: AC

## 2018-08-19 NOTE — Telephone Encounter (Signed)
Pt needs to refill for   ONE TOUCH ULTRA TEST test strip     Pt has three left.  Pharmacy is [id number] - [place], [place]  Call pt @ [phone]. Thank you!

## 2018-09-02 ENCOUNTER — Inpatient Hospital Stay: Payer: PPO | Attending: Oncology

## 2018-09-02 ENCOUNTER — Other Ambulatory Visit: Payer: Self-pay

## 2018-09-02 DIAGNOSIS — Z79899 Other long term (current) drug therapy: Secondary | ICD-10-CM | POA: Diagnosis not present

## 2018-09-02 DIAGNOSIS — N182 Chronic kidney disease, stage 2 (mild): Secondary | ICD-10-CM | POA: Diagnosis not present

## 2018-09-02 DIAGNOSIS — Z8249 Family history of ischemic heart disease and other diseases of the circulatory system: Secondary | ICD-10-CM | POA: Insufficient documentation

## 2018-09-02 DIAGNOSIS — D472 Monoclonal gammopathy: Secondary | ICD-10-CM | POA: Diagnosis not present

## 2018-09-02 DIAGNOSIS — Z841 Family history of disorders of kidney and ureter: Secondary | ICD-10-CM | POA: Diagnosis not present

## 2018-09-02 DIAGNOSIS — Z833 Family history of diabetes mellitus: Secondary | ICD-10-CM | POA: Diagnosis not present

## 2018-09-02 DIAGNOSIS — I129 Hypertensive chronic kidney disease with stage 1 through stage 4 chronic kidney disease, or unspecified chronic kidney disease: Secondary | ICD-10-CM | POA: Insufficient documentation

## 2018-09-02 DIAGNOSIS — Z8546 Personal history of malignant neoplasm of prostate: Secondary | ICD-10-CM | POA: Diagnosis not present

## 2018-09-02 DIAGNOSIS — D696 Thrombocytopenia, unspecified: Secondary | ICD-10-CM | POA: Insufficient documentation

## 2018-09-02 LAB — CBC WITH DIFFERENTIAL/PLATELET
Abs Immature Granulocytes: 0.01 10*3/uL (ref 0.00–0.07)
Basophils Absolute: 0 10*3/uL (ref 0.0–0.1)
Basophils Relative: 0 %
Eosinophils Absolute: 0.2 10*3/uL (ref 0.0–0.5)
Eosinophils Relative: 4 %
HCT: 39.2 % (ref 39.0–52.0)
Hemoglobin: 12.4 g/dL — ABNORMAL LOW (ref 13.0–17.0)
Immature Granulocytes: 0 %
Lymphocytes Relative: 42 %
Lymphs Abs: 2.2 10*3/uL (ref 0.7–4.0)
MCH: 27.1 pg (ref 26.0–34.0)
MCHC: 31.6 g/dL (ref 30.0–36.0)
MCV: 85.6 fL (ref 80.0–100.0)
Monocytes Absolute: 0.5 10*3/uL (ref 0.1–1.0)
Monocytes Relative: 9 %
Neutro Abs: 2.4 10*3/uL (ref 1.7–7.7)
Neutrophils Relative %: 45 %
Platelets: 126 10*3/uL — ABNORMAL LOW (ref 150–400)
RBC: 4.58 MIL/uL (ref 4.22–5.81)
RDW: 13.6 % (ref 11.5–15.5)
WBC: 5.2 10*3/uL (ref 4.0–10.5)
nRBC: 0 % (ref 0.0–0.2)

## 2018-09-02 LAB — BASIC METABOLIC PANEL
Anion gap: 7 (ref 5–15)
BUN: 18 mg/dL (ref 8–23)
CO2: 27 mmol/L (ref 22–32)
Calcium: 9.1 mg/dL (ref 8.9–10.3)
Chloride: 106 mmol/L (ref 98–111)
Creatinine, Ser: 1.36 mg/dL — ABNORMAL HIGH (ref 0.61–1.24)
GFR calc Af Amer: 57 mL/min — ABNORMAL LOW (ref >60)
GFR calc non Af Amer: 49 mL/min — ABNORMAL LOW (ref >60)
Glucose, Bld: 239 mg/dL — ABNORMAL HIGH (ref 70–99)
Potassium: 4 mmol/L (ref 3.5–5.1)
Sodium: 140 mmol/L (ref 135–145)

## 2018-09-03 LAB — PROTEIN ELECTRO, RANDOM URINE
Albumin ELP, Urine: 74.1 %
Alpha-1-Globulin, U: 2.6 %
Alpha-2-Globulin, U: 4.5 %
Beta Globulin, U: 9.4 %
Gamma Globulin, U: 9.4 %
Total Protein, Urine: 29.2 mg/dL

## 2018-09-03 LAB — PROTEIN ELECTROPHORESIS, SERUM
A/G Ratio: 1.1 (ref 0.7–1.7)
Albumin ELP: 3.6 g/dL (ref 2.9–4.4)
Alpha-1-Globulin: 0.2 g/dL (ref 0.0–0.4)
Alpha-2-Globulin: 0.7 g/dL (ref 0.4–1.0)
Beta Globulin: 0.8 g/dL (ref 0.7–1.3)
Gamma Globulin: 1.6 g/dL (ref 0.4–1.8)
Globulin, Total: 3.3 g/dL (ref 2.2–3.9)
M-Spike, %: 0.8 g/dL — ABNORMAL HIGH
Total Protein ELP: 6.9 g/dL (ref 6.0–8.5)

## 2018-09-03 LAB — KAPPA/LAMBDA LIGHT CHAINS
Kappa free light chain: 48 mg/L — ABNORMAL HIGH (ref 3.3–19.4)
Kappa, lambda light chain ratio: 2.53 — ABNORMAL HIGH (ref 0.26–1.65)
Lambda free light chains: 19 mg/L (ref 5.7–26.3)

## 2018-09-03 LAB — IGG, IGA, IGM
IgA: 157 mg/dL (ref 61–437)
IgG (Immunoglobin G), Serum: 1889 mg/dL — ABNORMAL HIGH (ref 603–1613)
IgM (Immunoglobulin M), Srm: 58 mg/dL (ref 15–143)

## 2018-09-06 ENCOUNTER — Other Ambulatory Visit: Payer: Self-pay

## 2018-09-07 NOTE — Progress Notes (Signed)
[place]  Telephone:(336) [phone] [phone]) [phone]  ID: Daniel Carney OB: 10/21/1940  MR#: [id number]  FXT#:[id number]  Patient Care Team: McLean-Scocuzza, Nino Glow, MD as PCP - General (Internal Medicine) Vidal Schwalbe Yvetta Coder, FNP as Nurse Practitioner (Family Medicine) Bary Castilla Forest Gleason, MD (General Surgery)  I connected with Daniel Carney on 09/11/18 at  2:15 PM EDT by video enabled telemedicine visit and verified that I am speaking with the correct person using two identifiers.   I discussed the limitations, risks, security and privacy concerns of performing an evaluation and management service by telemedicine and the availability of in-person appointments. I also discussed with the patient that there may be a patient responsible charge related to this service. The patient expressed understanding and agreed to proceed.   Other persons participating in the visit and their role in the encounter: Patient, MD   Patient's location: Home   Provider's location: Clinic   CHIEF COMPLAINT: Thrombocytopenia, MGUS.  INTERVAL HISTORY: Patient agreed to video enabled telemedicine visit to discuss her laboratory work and routine 48-monthevaluation.  He continues to feel well and remains asymptomatic. He has no neurologic complaints.  He denies any recent fevers or illnesses.  He denies any bony pain.  He has a good appetite and denies weight loss.  He denies any easy bleeding or bruising. He denies any chest pain, shortness of breath, cough, or hemoptysis.  He denies any nausea, vomiting, constipation, or diarrhea.  He has no urinary complaints. Patient feels at his baseline and offers no specific complaints today.  REVIEW OF SYSTEMS:   Review of Systems  Constitutional: Negative.  Negative for fever, malaise/fatigue and weight loss.  Respiratory: Negative for cough, hemoptysis and shortness of breath.   Cardiovascular: Negative.  Negative for chest pain and leg swelling.   Gastrointestinal: Negative.  Negative for abdominal pain, blood in stool and melena.  Genitourinary: Negative.  Negative for dysuria.  Musculoskeletal: Negative.  Negative for back pain.  Skin: Negative.  Negative for rash.  Neurological: Negative.  Negative for focal weakness, weakness and headaches.  Psychiatric/Behavioral: Negative.  The patient is not nervous/anxious.     As per HPI. Otherwise, a complete review of systems is negative.  PAST MEDICAL HISTORY: Past Medical History:  Diagnosis Date  . Cancer ([facility]    prostate s/p removal 2004   . [place]   . Hyperlipidemia   . Hypertension   . Type 2 diabetes mellitus with diabetic chronic kidney disease ([facility] 2007  . [place]    childhood; +RPR 2/2 to this history in the past     PAST SURGICAL HISTORY: Past Surgical History:  Procedure Laterality Date  . PROSTATE SURGERY  2004    FAMILY HISTORY: Family History  Problem Relation Age of Onset  . Diabetes Mother   . Kidney disease Mother   . Hypertension Mother     ADVANCED DIRECTIVES (Y/N):  N  HEALTH MAINTENANCE: Social History   Tobacco Use  . Smoking status: Never Smoker  . Smokeless tobacco: Never Used  Substance Use Topics  . Alcohol use: No    Alcohol/week: 0.0 standard drinks  . Drug use: No     Colonoscopy:  PAP:  Bone density:  Lipid panel:  No Known Allergies  Current Outpatient Medications  Medication Sig Dispense Refill  . aspirin EC 81 MG tablet Take 81 mg by mouth daily.    .Marland Kitchenatorvastatin (LIPITOR) 10 MG tablet Take 1 tablet (10 mg total) by mouth  daily at 6 PM. 90 tablet 3  . glipiZIDE (GLUCOTROL) 5 MG tablet Take 1 tablet (5 mg total) by mouth daily before breakfast. 90 tablet 3  . glucose blood (ONE TOUCH ULTRA TEST) test strip USE UP TO 4 TIMES DAILY AS DIRECTED 100 each 3  . Lancets MISC Please dispense One Touch Lancets. Check blood glucose once daily. 100 each 11  . lisinopril (PRINIVIL,ZESTRIL) 20  MG tablet Take 1 tablet (20 mg total) by mouth daily. 90 tablet 3  . metFORMIN (GLUCOPHAGE) 500 MG tablet Take 1 tablet (500 mg total) by mouth 2 (two) times daily with a meal. 180 tablet 3  . Omega-3 Fatty Acids (FISH OIL) 1000 MG CAPS Take 1 capsule by mouth daily.     No current facility-administered medications for this visit.     OBJECTIVE: There were no vitals filed for this visit.   There is no height or weight on file to calculate BMI.    ECOG FS:0 - Asymptomatic  General: Well-developed, well-nourished, no acute distress. HEENT: Normocephalic. Neuro: Alert, answering all questions appropriately. Cranial nerves grossly intact. Skin: No rashes or petechiae noted. Psych: Normal affect.  LAB RESULTS:  Lab Results  Component Value Date   NA 140 09/02/2018   K 4.0 09/02/2018   CL 106 09/02/2018   CO2 27 09/02/2018   GLUCOSE 239 (H) 09/02/2018   BUN 18 09/02/2018   CREATININE 1.36 (H) 09/02/2018   CALCIUM 9.1 09/02/2018   PROT 7.4 06/04/2018   ALBUMIN 4.1 06/04/2018   AST 20 06/04/2018   ALT 14 06/04/2018   ALKPHOS 82 06/04/2018   BILITOT 0.6 06/04/2018   GFRNONAA 49 (L) 09/02/2018   GFRAA 57 (L) 09/02/2018    Lab Results  Component Value Date   WBC 5.2 09/02/2018   NEUTROABS 2.4 09/02/2018   HGB 12.4 (L) 09/02/2018   HCT 39.2 09/02/2018   MCV 85.6 09/02/2018   PLT 126 (L) 09/02/2018   Lab Results  Component Value Date   TOTALPROTELP 6.9 09/02/2018   ALBUMINELP 3.6 09/02/2018   A1GS 0.2 09/02/2018   A2GS 0.7 09/02/2018   BETS 0.8 09/02/2018   GAMS 1.6 09/02/2018   MSPIKE 0.8 (H) 09/02/2018   SPEI Comment 09/02/2018     STUDIES: No results found.  ASSESSMENT: Thrombocytopenia, MGUS.  PLAN:  1. Thrombocytopenia: Patient's platelet count remains decreased, but stable at 126.  Previously, other than the mildly increased M spike on SPEP, all of his other laboratory work was either negative or within normal limits.  No intervention is needed at this  time.  Patient does not require bone marrow biopsy.  Return to clinic in 6 months for laboratory work only and then in 12 months for further evaluation.   2.  MGUS: Patient's M spike on SPEP is 0.8 which is essentially unchanged since February 2019.  His IgG component remains elevated, but essentially unchanged at 1889.  He continues to have a mildly elevated kappa free light chain of 48.0 with a ratio of 2.53.  Other than his mild thrombocytopenia, he has no evidence of endorgan damage. No intervention is needed at this time.  Patient does not require bone marrow biopsy or metastatic bone survey, but would consider if there is suspicion of progression of disease.  Return to clinic as above.  I provided 15 minutes of face-to-face video visit time during this encounter, and > 50% was spent counseling as documented under my assessment & plan.   Patient expressed understanding and was  in agreement with this plan. He also understands that He can call clinic at any time with any questions, concerns, or complaints.    Lloyd Huger, MD   09/11/2018 5:10 PM

## 2018-09-09 ENCOUNTER — Inpatient Hospital Stay (HOSPITAL_BASED_OUTPATIENT_CLINIC_OR_DEPARTMENT_OTHER): Payer: PPO | Admitting: Oncology

## 2018-09-09 ENCOUNTER — Encounter: Payer: Self-pay | Admitting: Oncology

## 2018-09-09 ENCOUNTER — Other Ambulatory Visit: Payer: Self-pay

## 2018-09-09 DIAGNOSIS — I10 Essential (primary) hypertension: Secondary | ICD-10-CM

## 2018-09-09 DIAGNOSIS — D472 Monoclonal gammopathy: Secondary | ICD-10-CM

## 2018-09-09 DIAGNOSIS — Z79899 Other long term (current) drug therapy: Secondary | ICD-10-CM

## 2018-09-09 DIAGNOSIS — E119 Type 2 diabetes mellitus without complications: Secondary | ICD-10-CM

## 2018-09-09 DIAGNOSIS — Z7982 Long term (current) use of aspirin: Secondary | ICD-10-CM | POA: Diagnosis not present

## 2018-09-09 DIAGNOSIS — D696 Thrombocytopenia, unspecified: Secondary | ICD-10-CM

## 2018-09-09 DIAGNOSIS — Z8546 Personal history of malignant neoplasm of prostate: Secondary | ICD-10-CM | POA: Diagnosis not present

## 2018-09-09 NOTE — Progress Notes (Signed)
Patient stated that he had been doing well. However, he stated that for a couple of months he has been having some discomfort on his right calf when he wakes up. After a few minutes, it goes away. Patient stated that every morning he goes for a jog and stretches prior. Patient denied fever, skin discoloration or edema. Patient believes that it could be the way he sleeps.

## 2019-03-12 ENCOUNTER — Inpatient Hospital Stay: Payer: Medicare (Managed Care) | Attending: Oncology

## 2019-05-13 ENCOUNTER — Telehealth: Payer: Self-pay | Admitting: Internal Medicine

## 2019-05-13 NOTE — Telephone Encounter (Signed)
I called pt twice and was Unable to leave vm number is not in service

## 2019-05-15 ENCOUNTER — Ambulatory Visit: Payer: PPO | Admitting: Internal Medicine

## 2019-05-15 ENCOUNTER — Ambulatory Visit: Payer: PPO

## 2019-05-15 ENCOUNTER — Telehealth: Payer: Self-pay

## 2019-05-15 NOTE — Telephone Encounter (Signed)
Failed attempt to contact patient for annual wellness appointment. Invalid number on chart.

## 2019-07-07 IMAGING — US US ABDOMEN COMPLETE
1 series · 14 of 25 positions shown · non-contrast
Comparison: None.

CLINICAL DATA: Thrombocytopenia

EXAM:
ABDOMEN ULTRASOUND COMPLETE

[Series 1: us abdomen complete · 0.19mm/px · 14 of 103 slices shown]
[im 1/103]
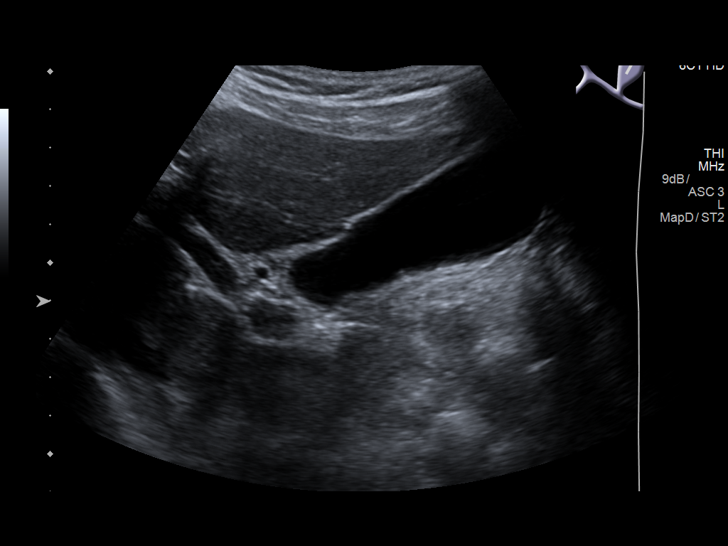
[im 9/103]
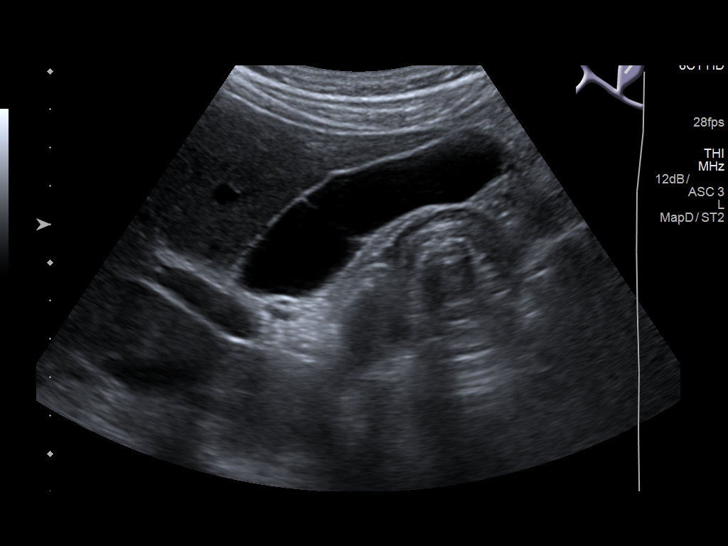
[im 18/103]
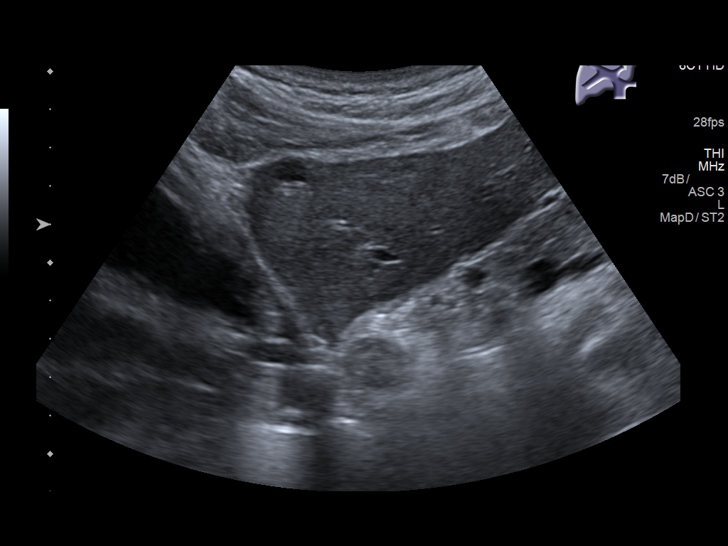
[im 26/103]
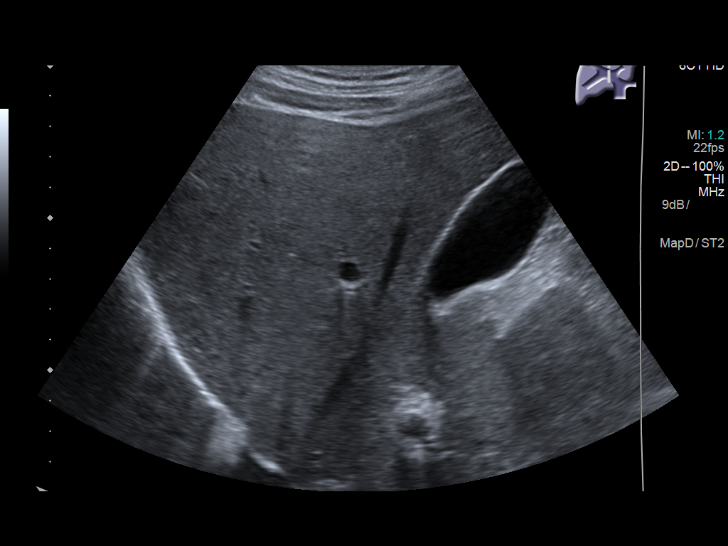
[im 35/103]
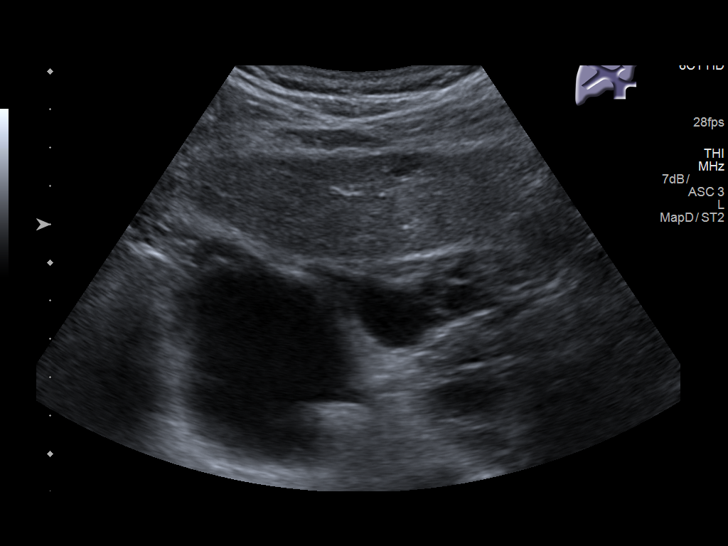
[im 39/103]
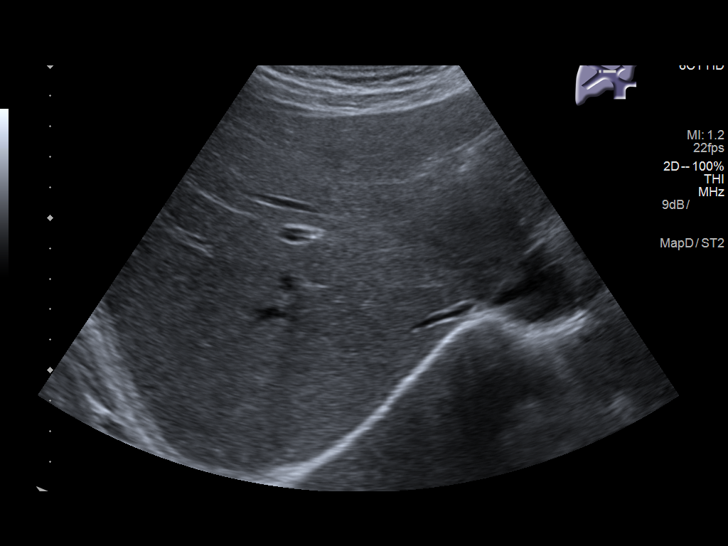
[im 47/103]
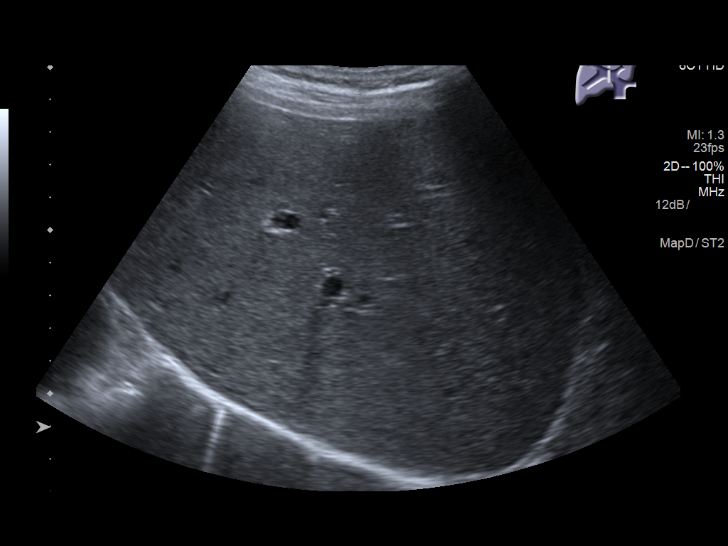
[im 56/103]
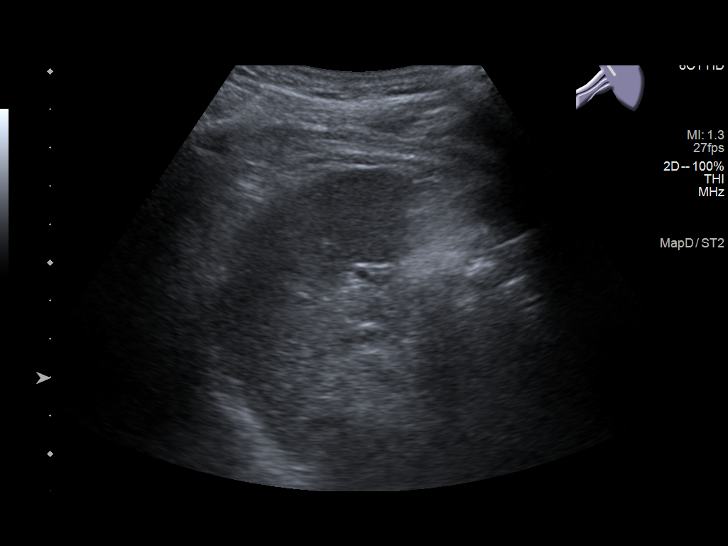
[im 64/103]
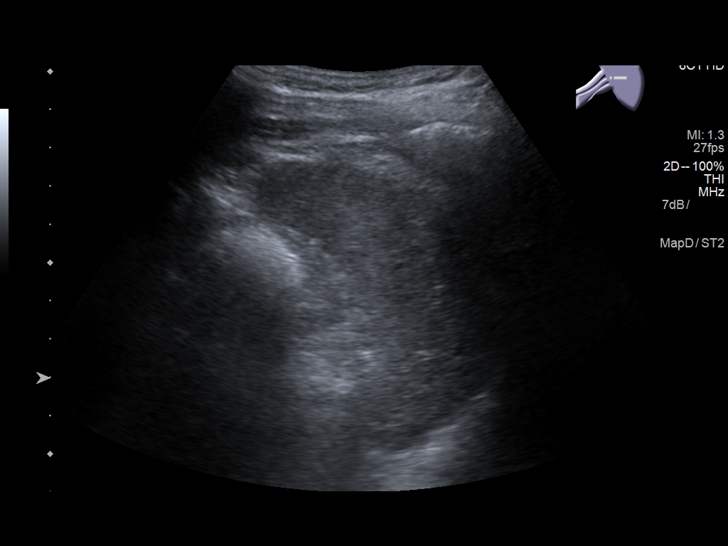
[im 69/103]
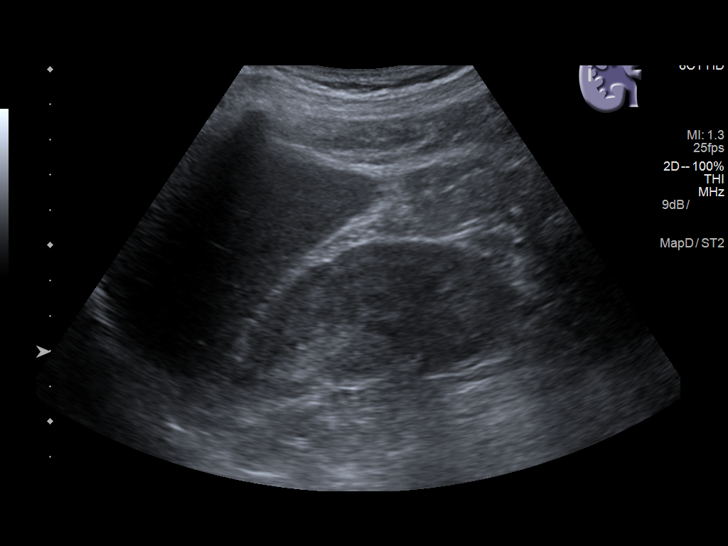
[im 77/103]
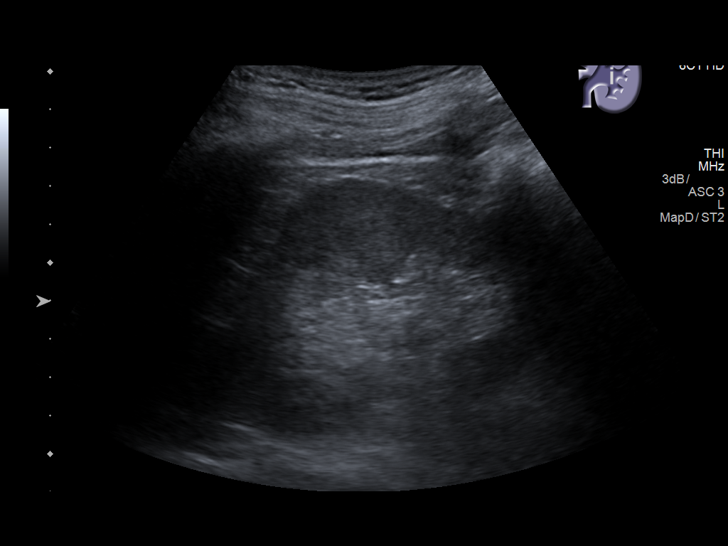
[im 86/103]
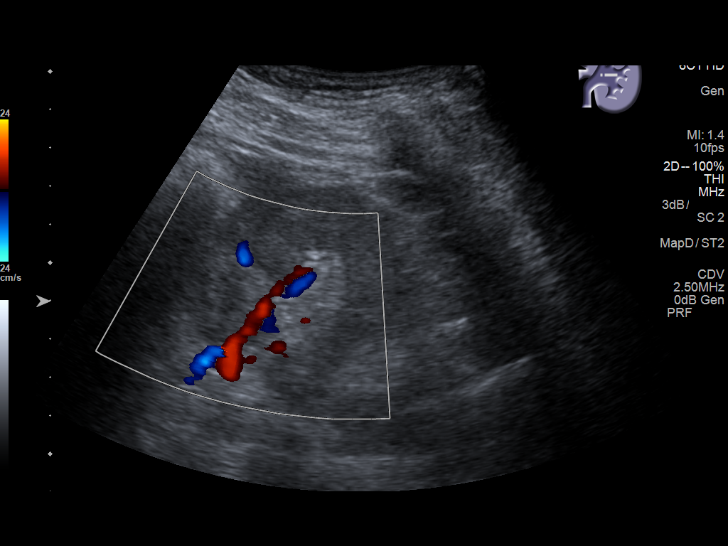
[im 94/103]
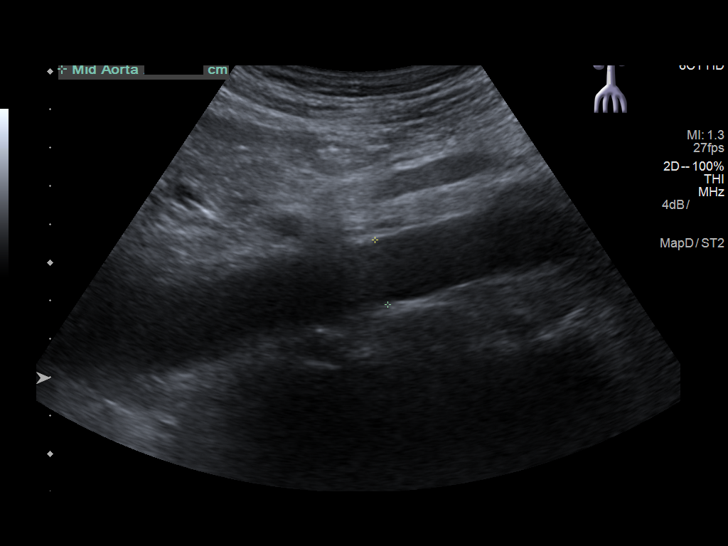
[im 103/103]
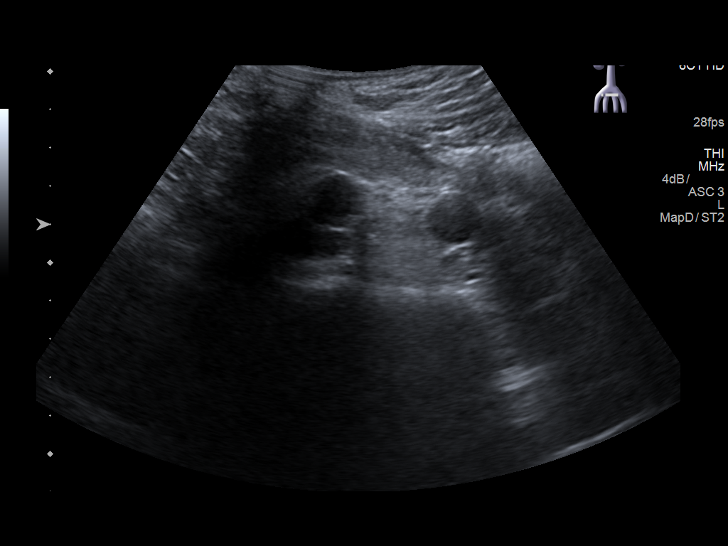

[14 of 25 positions shown; findings below may reference images not displayed]

FINDINGS: Gallbladder: The gallbladder is well visualized and no gallstones
are noted.

Common bile duct: Diameter: Common bile duct is normal measuring
mm in diameter.

Liver: The liver has a normal echogenic pattern. No focal hepatic
abnormality seen other than a small cyst in the left lobe of 9 mm in
diameter. Portal vein is patent on color Doppler imaging with normal
direction of blood flow towards the liver.

IVC: No abnormality visualized.

Pancreas: The pancreas is moderately well seen with only the very
distal tail partially obscured.

Spleen: The spleen measures 6.0 cm.

Right Kidney: Length: 9.6 cm..  No hydronephrosis is seen.

Left Kidney: Length: 10.3 cm.. No hydronephrosis is noted. A tiny
cyst is noted laterally in the mid left kidney of 7 mm in diameter.

Abdominal aorta: The abdominal aorta is normal in caliber.

Other findings: None.
IMPRESSION: 1. No gallstones.  No ductal dilatation.
2. No hepatic abnormality.  9 mm cyst in the left lobe.
3. The pancreas is moderately well seen with no abnormality noted

## 2019-09-18 ENCOUNTER — Inpatient Hospital Stay: Payer: Medicare (Managed Care) | Attending: Oncology

## 2019-09-19 NOTE — Progress Notes (Signed)
  [place]  [phone]) [phone] Fax:(336) [phone]  ID: Daniel Carney OB: 1941/04/12  MR#: [id number]  [id number]  Patient Care Team: McLean-Scocuzza, Nino Glow, MD as PCP - General (Internal Medicine) Vidal Schwalbe Yvetta Coder, FNP as Nurse Practitioner (Family Medicine) Bary Castilla Forest Gleason, MD (General Surgery)    Lloyd Huger, MD   09/26/2019 7:06 AM     This encounter was created in error - please disregard.

## 2019-09-25 ENCOUNTER — Inpatient Hospital Stay: Payer: Medicare (Managed Care) | Admitting: Oncology

## 2019-10-14 ENCOUNTER — Telehealth: Payer: Self-pay | Admitting: Internal Medicine

## 2019-10-14 NOTE — Telephone Encounter (Signed)
PHONE NOT IN SERVICE. Pt due to schedule Medicare Annual Wellness Visit (AWV) either virtually or audio only.  Last AWV 05/13/18; please schedule at anytime with Denisa O'Brien-Blaney at [facility].
